# Patient Record
Sex: Male | Born: 1985 | Race: Black or African American | Hispanic: No | Marital: Single | Smoking: Never smoker
Health system: Southern US, Community
[De-identification: ages and names within clinical notes are randomized; demographics above are authoritative.]

## PROBLEM LIST (undated history)

## (undated) HISTORY — PX: TONSILLECTOMY: SUR1361

---
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
## 2012-02-22 ENCOUNTER — Ambulatory Visit (INDEPENDENT_AMBULATORY_CARE_PROVIDER_SITE_OTHER): Payer: BC Managed Care – PPO | Admitting: Family Medicine

## 2012-02-22 ENCOUNTER — Encounter: Payer: Self-pay | Admitting: Family Medicine

## 2012-02-22 VITALS — BP 118/72 | HR 62 | Temp 98.4°F | Ht 73.0 in | Wt 208.6 lb

## 2012-02-22 DIAGNOSIS — Z0000 Encounter for general adult medical examination without abnormal findings: Secondary | ICD-10-CM

## 2012-02-22 LAB — CBC WITH DIFFERENTIAL/PLATELET
Basophils Relative: 0.8 % (ref 0.0–3.0)
Eosinophils Relative: 1.5 % (ref 0.0–5.0)
HCT: 41.3 % (ref 39.0–52.0)
MCV: 95.2 fl (ref 78.0–100.0)
Monocytes Relative: 10.1 % (ref 3.0–12.0)
Neutrophils Relative %: 35.8 % — ABNORMAL LOW (ref 43.0–77.0)
Platelets: 234 10*3/uL (ref 150.0–400.0)
RBC: 4.33 Mil/uL (ref 4.22–5.81)
WBC: 4.1 10*3/uL — ABNORMAL LOW (ref 4.5–10.5)

## 2012-02-22 LAB — LIPID PANEL
HDL: 36 mg/dL — ABNORMAL LOW (ref >39.00)
Total CHOL/HDL Ratio: 5

## 2012-02-22 LAB — BASIC METABOLIC PANEL
BUN: 12 mg/dL (ref 6–23)
CO2: 27 mEq/L (ref 19–32)
Glucose, Bld: 97 mg/dL (ref 70–99)
Potassium: 4.1 mEq/L (ref 3.5–5.1)
Sodium: 138 mEq/L (ref 135–145)

## 2012-02-22 LAB — HEPATIC FUNCTION PANEL
ALT: 21 U/L (ref 0–53)
AST: 29 U/L (ref 0–37)
Albumin: 4.5 g/dL (ref 3.5–5.2)
Alkaline Phosphatase: 62 U/L (ref 39–117)
Total Protein: 7.4 g/dL (ref 6.0–8.3)

## 2012-02-22 LAB — TSH: TSH: 1.06 u[IU]/mL (ref 0.35–5.50)

## 2012-02-22 NOTE — Patient Instructions (Addendum)
Follow up in 1 year or as needed We'll notify you of your lab results Keep up the good work- you look great! Call with any questions or concerns Welcome!  We're glad to have you!!

## 2012-02-22 NOTE — Assessment & Plan Note (Signed)
New.  Pt's PE WNL.  Refusing flu shot.  Check labs.  Anticipatory guidance provided.

## 2012-02-22 NOTE — Progress Notes (Signed)
  Subjective:    Patient ID: Jackson Cox, male    DOB: December 01, 1985, 26 y.o.   MRN: [id number]  HPI New to establish.  No previous MD.  No concerns today.   Review of Systems Patient reports no vision/hearing changes, anorexia, fever ,adenopathy, persistant/recurrent hoarseness, swallowing issues, chest pain, palpitations, edema, persistant/recurrent cough, hemoptysis, dyspnea (rest,exertional, paroxysmal nocturnal), gastrointestinal  bleeding (melena, rectal bleeding), abdominal pain, excessive heart burn, GU symptoms (dysuria, hematuria, voiding/incontinence issues) syncope, focal weakness, memory loss, numbness & tingling, skin/hair/nail changes, depression, anxiety, abnormal bruising/bleeding, musculoskeletal symptoms/signs.     Objective:   Physical Exam BP 118/72  Pulse 62  Temp 98.4 F (36.9 C) (Oral)  Ht 6\' 1"  (1.854 m)  Wt 208 lb 9.6 oz (94.62 kg)  BMI 27.52 kg/m2  SpO2 99%  General Appearance:    Alert, cooperative, no distress, appears stated age  Head:    Normocephalic, without obvious abnormality, atraumatic  Eyes:    PERRL, conjunctiva/corneas clear, EOM's intact, fundi    benign, both eyes       Ears:    Normal TM's and external ear canals, both ears  Nose:   Nares normal, septum midline, mucosa normal, no drainage   or sinus tenderness  Throat:   Lips, mucosa, and tongue normal; teeth and gums normal  Neck:   Supple, symmetrical, trachea midline, no adenopathy;       thyroid:  No enlargement/tenderness/nodules; no carotid   bruit or JVD  Back:     Symmetric, no curvature, ROM normal, no CVA tenderness  Lungs:     Clear to auscultation bilaterally, respirations unlabored  Chest wall:    No tenderness or deformity  Heart:    Regular rate and rhythm, S1 and S2 normal, no murmur, rub   or gallop  Abdomen:     Soft, non-tender, bowel sounds active all four quadrants,    no masses, no organomegaly  Genitalia:    Normal male without lesion, discharge or tenderness    Rectal:      Extremities:   Extremities normal, atraumatic, no cyanosis or edema  Pulses:   2+ and symmetric all extremities  Skin:   Skin color, texture, turgor normal, no rashes or lesions  Lymph nodes:   Cervical, supraclavicular, and axillary nodes normal  Neurologic:   CNII-XII intact. Normal strength, sensation and reflexes      throughout          Assessment & Plan:

## 2012-04-01 ENCOUNTER — Encounter: Payer: Self-pay | Admitting: Internal Medicine

## 2012-04-01 ENCOUNTER — Ambulatory Visit (INDEPENDENT_AMBULATORY_CARE_PROVIDER_SITE_OTHER): Payer: BC Managed Care – PPO | Admitting: Internal Medicine

## 2012-04-01 VITALS — BP 130/80 | HR 77 | Wt 205.0 lb

## 2012-04-01 DIAGNOSIS — G8929 Other chronic pain: Secondary | ICD-10-CM

## 2012-04-01 DIAGNOSIS — M545 Low back pain: Secondary | ICD-10-CM

## 2012-04-01 DIAGNOSIS — R52 Pain, unspecified: Secondary | ICD-10-CM

## 2012-04-01 MED ORDER — TRAMADOL HCL 50 MG PO TABS: 50.0000 mg | ORAL_TABLET | Freq: Four times a day (QID) | ORAL | Status: DC | PRN

## 2012-04-01 MED ORDER — CYCLOBENZAPRINE HCL 5 MG PO TABS: ORAL_TABLET | ORAL | Status: DC

## 2012-04-01 NOTE — Progress Notes (Signed)
  Subjective:    Patient ID: Jackson Cox, male    DOB: 1985/10/15, 26 y.o.   MRN: [id number]  HPI  Symptoms began 03/31/12 after awakening; it was aggravated when he twisted his torso getting into his car. The pain is sharp with movement; but is present as a dull discomfort even sitting. Has only been partially responsive to ice, heat, and wearing a brace.  It is localized to the mid lower back w/o radiation.  This actually represents a recurrence of a chronic issue. He's had similar symptoms in the past which he relates to an arena football injury to the lower back.    Review of Systems   Fecal/urinary incontinence: no Numbness/Weakness: no  Night pain: only with movement  h/o cancer/immunosuppression:no PMH of  chronic steroid use: no       Objective:   Physical Exam  He is extremely fit and well muscled; he is obviously uncomfortable but in no acute distress  Gait is slow but otherwise normal. Tiptoe and heel walking is completed without difficulty  There is subtle asymmetry of the posterior para-spinous thoracic muscles; right larger than left. This is accentuated when he sits. Also while sitting there is asymmetry of the lumbar paraspinous muscles with the left greater than right.  He is able to lie flat and sit up without help. Straight leg raising is normal or negative.  Deep tendon reflexes, tone, and strength are otherwise normal.        Assessment & Plan:   #1 acute low back syndrome; clinically occult scoliosis is suggested.  Plan: See orders and recommendations

## 2012-04-01 NOTE — Patient Instructions (Addendum)
The best exercises for the low back include freestyle swimming, stretch aerobics, and yoga. 

## 2013-04-28 ENCOUNTER — Ambulatory Visit (INDEPENDENT_AMBULATORY_CARE_PROVIDER_SITE_OTHER): Payer: BC Managed Care – PPO | Admitting: Internal Medicine

## 2013-04-28 VITALS — BP 120/78 | HR 64 | Temp 98.7°F | Resp 16 | Ht 72.5 in | Wt 208.2 lb

## 2013-04-28 DIAGNOSIS — H5711 Ocular pain, right eye: Secondary | ICD-10-CM

## 2013-04-28 DIAGNOSIS — S058X2A Other injuries of left eye and orbit, initial encounter: Secondary | ICD-10-CM

## 2013-04-28 DIAGNOSIS — H5710 Ocular pain, unspecified eye: Secondary | ICD-10-CM

## 2013-04-28 DIAGNOSIS — S0590XA Unspecified injury of unspecified eye and orbit, initial encounter: Secondary | ICD-10-CM

## 2013-04-28 MED ORDER — OFLOXACIN 0.3 % OP SOLN: 1.0000 [drp] | OPHTHALMIC | Status: DC

## 2013-04-28 NOTE — Progress Notes (Signed)
  Subjective:    Patient ID: Jackson Cox, male    DOB: 03/05/1986, 27 y.o.   MRN: [id number]  HPI Jackson Cox is a 27 years old patient who present to the office complaining about having a foreign body  in his R Eye since yesterday, after  he took his shower. This morning he woke up with the same  discomfort in the eye and feel like he has some hair in the eye He used a chemical on his facial skin and may have got some in right eye. No vision loss, no discharge, minimal erythema. No contact lens use.    Review of Systems     Objective:   Physical Exam  Constitutional: He is oriented to person, place, and time. He appears well-developed and well-nourished.  HENT:  Head: Normocephalic.  Right Ear: External ear normal.  Left Ear: External ear normal.  Nose: Nose normal.  Mouth/Throat: Oropharynx is clear and moist.  Eyes: EOM and lids are normal. Pupils are equal, round, and reactive to light. Lids are everted and swept, no foreign bodies found. Right eye exhibits no chemosis, no discharge, no exudate and no hordeolum. No foreign body present in the right eye. Left eye exhibits no chemosis, no discharge, no exudate and no hordeolum. No foreign body present in the left eye. Right conjunctiva has no hemorrhage. Left conjunctiva is not injected. Left conjunctiva has no hemorrhage.  Slit lamp exam:      The right eye shows no corneal abrasion, no corneal flare, no corneal ulcer, no foreign body and no fluorescein uptake.    Neck: Normal range of motion.  Pulmonary/Chest: Effort normal.  Neurological: He is alert and oriented to person, place, and time. No cranial nerve deficit. He exhibits normal muscle tone. Coordination normal.  Skin: No rash noted.  Psychiatric: He has a normal mood and affect.          Assessment & Plan:  Ocuflox/Warm compresses

## 2013-04-28 NOTE — Patient Instructions (Signed)
Chemical Conjunctivitis  Chemical conjunctivitis is an irritation of the underside of the eyelid and the white part of the eye. Conjunctivitis can be caused by infection, allergy or chemical irritation. In your case it has been caused by a chemical irritation of the eye. Symptoms almost always include: tearing, light sensitivity, gritty feeling (sensation) in the eyes, swelling of your eyelids, and often severe pain. In spite of the severe pain, this irritation will run its course and will improve within 24 hours.   HOME CARE INSTRUCTIONS   · To ease discomfort apply a cool, clean wash cloth to your eye for 10 to 20 minutes, 3 to 4 times per day.  · Do not rub your eyes.  · Gently wipe away any discharge from the eyes with moistened tissues.  · Wash your hands often with soap and use paper towels to dry.  · Sunglasses may be helpful if light bothers your eyes.  · Do not use eye make-up.  · Do not use contact lenses until the irritation is gone.  · Do not operate machinery or drive if your vision is blurred.  · Take medications as directed by your caregiver. Artificial tears may ease discomfort.  · Avoid the chemical or surroundings which caused the problem. Always use eye protection as necessary.  SEEK MEDICAL CARE IF:   · The eye is still pink (inflamed) 3 days after beginning treatment.  · Pain in the eye increases.  · You have discharge coming from either eye.  · Your eyelids are stuck together in the morning.  · You have an increased sensitivity to light.  · An oral temperature above 102° F (38.9° C) develops.  · You develop facial pain.  · You have any problems that may be related to the medicine you are taking.  SEEK IMMEDIATE MEDICAL CARE IF:   · Your vision is getting worse.  · You develop severe eye pain.  MAKE SURE YOU:   · Understand these instructions.  · Will watch your condition.  · Will get help right away if you are not doing well or get worse.  Document Released: 03/07/2005 Document Revised:  08/20/2011 Document Reviewed: 01/14/2008  [employer]® Patient Information ©2014 [employer].

## 2014-07-29 ENCOUNTER — Encounter: Payer: Self-pay | Admitting: Family Medicine

## 2014-07-29 ENCOUNTER — Ambulatory Visit (INDEPENDENT_AMBULATORY_CARE_PROVIDER_SITE_OTHER): Payer: BLUE CROSS/BLUE SHIELD | Admitting: Family Medicine

## 2014-07-29 VITALS — BP 122/64 | HR 79 | Temp 97.9°F | Resp 16 | Wt 207.8 lb

## 2014-07-29 DIAGNOSIS — J309 Allergic rhinitis, unspecified: Secondary | ICD-10-CM | POA: Insufficient documentation

## 2014-07-29 DIAGNOSIS — M79641 Pain in right hand: Secondary | ICD-10-CM

## 2014-07-29 DIAGNOSIS — J3089 Other allergic rhinitis: Secondary | ICD-10-CM

## 2014-07-29 DIAGNOSIS — M79642 Pain in left hand: Secondary | ICD-10-CM

## 2014-07-29 MED ORDER — FLUTICASONE PROPIONATE 50 MCG/ACT NA SUSP: 2.0000 | Freq: Every day | NASAL | Status: DC

## 2014-07-29 MED ORDER — MELOXICAM 15 MG PO TABS: 15.0000 mg | ORAL_TABLET | Freq: Every day | ORAL | Status: DC

## 2014-07-29 NOTE — Patient Instructions (Signed)
Schedule your complete physical in 6 months Continue the Zyrtec daily Add Flonase- 2 sprays each nostril daily Drink plenty of fluids Start the Mobic- once daily w/ food- for 7-10 days and then as needed for pain Add tylenol as needed but no additional ibuprofen, aleve, motrin, etc If no improvement in the hand pain in the next 2 weeks, call me! Ozzie HoyleGALIND WASHINGTON- get your butt to the gym and work out w/ your son, who is a Systems analystpersonal trainer!!!  Doctor's orders! Call with any questions or concerns Hang in there!!!

## 2014-07-29 NOTE — Assessment & Plan Note (Signed)
New.  Suspect this is due to pt recently starting to play guitar and muscle strain/fatigue.  No joint tenderness that would be indicative of RA.  Start scheduled NSAIDs.  Reviewed supportive care and red flags that should prompt return.  Pt expressed understanding and is in agreement w/ plan.

## 2014-07-29 NOTE — Assessment & Plan Note (Signed)
New.  Continue Zyrtec.  Add nasal steroid.  Mucinex DM for relief.  Reviewed supportive care and red flags that should prompt return.  Pt expressed understanding and is in agreement w/ plan.

## 2014-07-29 NOTE — Progress Notes (Signed)
Pre visit review using our clinic review tool, if applicable. No additional management support is needed unless otherwise documented below in the visit note. 

## 2014-07-29 NOTE — Progress Notes (Signed)
   Subjective:    Patient ID: Jackson Cox, male    DOB: Dec 15, 1985, 29 y.o.   MRN: [id number]  HPI URI- sxs started as allergies.  No response to Zyrtec.  Took OTC cold and sinus meds w/o relief.  Developed swelling and tenderness on L side of neck.  No facial pain/pressure.  No sore throat since Day 1.  No fevers.  No ear pain/pressure.  + nasal congestion.  + PND.  + cough- intermittently productive.  + sick contacts.  Bilateral hand pain- sxs started 3 weeks ago.  + stiffness/soreness.  Pain both in AM and throughout the day.  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  No pain in joints- either PIP or MCP   Review of Systems For ROS see HPI     Objective:   Physical Exam  Constitutional: He appears well-developed and well-nourished. No distress.  HENT:  Head: Normocephalic and atraumatic.  No TTP over sinuses + turbinate edema + PND TMs normal bilaterally  Eyes: Conjunctivae and EOM are normal. Pupils are equal, round, and reactive to light.  Neck: Normal range of motion. Neck supple.  Cardiovascular: Normal rate, regular rhythm and normal heart sounds.   Pulmonary/Chest: Effort normal and breath sounds normal. No respiratory distress. He has no wheezes.  Musculoskeletal: He exhibits tenderness (TTP over proximal phlanges w/o swelling.  no TTP over PIP and MCP joints). He exhibits no edema.  Lymphadenopathy:    He has no cervical adenopathy.  Skin: Skin is warm and dry.  Vitals reviewed.         Assessment & Plan:

## 2014-08-02 ENCOUNTER — Encounter: Payer: Self-pay | Admitting: Family Medicine

## 2014-09-19 ENCOUNTER — Ambulatory Visit (INDEPENDENT_AMBULATORY_CARE_PROVIDER_SITE_OTHER): Payer: BLUE CROSS/BLUE SHIELD | Admitting: Family Medicine

## 2014-09-19 VITALS — BP 124/62 | HR 62 | Temp 97.8°F | Resp 20 | Ht 72.0 in | Wt 209.1 lb

## 2014-09-19 DIAGNOSIS — S39012A Strain of muscle, fascia and tendon of lower back, initial encounter: Secondary | ICD-10-CM

## 2014-09-19 DIAGNOSIS — M62830 Muscle spasm of back: Secondary | ICD-10-CM | POA: Diagnosis not present

## 2014-09-19 MED ORDER — CYCLOBENZAPRINE HCL 5 MG PO TABS: ORAL_TABLET | ORAL | Status: DC

## 2014-09-19 NOTE — Patient Instructions (Signed)
You likely have a sprained ligament or strained muscle in the low back, which can lead to some muscle spasm as well. Try the mobic each morning (do not combine with other over the counter pain relievers), flexeril at night if needed.  Heat or ice to area as needed and the other treatments and exercises in the back care manual as tolerated.   Return to the clinic or go to the nearest emergency room if any of your symptoms worsen or new symptoms occur.  If back pain not improving in next 10 days, or these flares persist - return to discuss further evaluation and imaging.

## 2014-09-19 NOTE — Progress Notes (Addendum)
Subjective:  This chart was scribed for Jackson Staggers, MD by [facility], medical scribe at [facility].The patient was seen in exam room 12 and the patient's care was started at 10:33 AM.   Patient ID: Jackson Cox, male    DOB: 10/24/1985, 29 y.o.   MRN: [id number] Chief Complaint  Patient presents with  . Back Pain    lower back spasms.   reinjured his back yesterday   HPI HPI Comments: KASE SHUGHART is a 29 y.o. male who presents to [facility] complaining of recurrent non radiating lower back pain, acute onset one day ago, while playing football. He turned wrong and "tweaked" his back. He had a football injury about 7-8 years ago he took a helmet to the lower back. He is a Systems analyst at [employer]. He has taken flexeril in the past which has provided relief. He denies urine, bowel incontinence, numbness in his groin, numbness or weakness in his lower extremities.  Patient Active Problem List   Diagnosis Date Noted  . Allergic rhinitis 07/29/2014  . Bilateral hand pain 07/29/2014   History reviewed. No pertinent past medical history. Past Surgical History  Procedure Laterality Date  . Tonsillectomy     No Known Allergies Prior to Admission medications   Medication Sig Start Date End Date Taking? Authorizing Provider  cetirizine (ZYRTEC) 10 MG tablet Take 10 mg by mouth daily.   Yes Historical Provider, MD  fluticasone (FLONASE) 50 MCG/ACT nasal spray Place 2 sprays into both nostrils daily. 07/29/14  Yes Sheliah Hatch, MD  meloxicam (MOBIC) 15 MG tablet Take 1 tablet (15 mg total) by mouth daily. 07/29/14  Yes Sheliah Hatch, MD  Multiple Vitamins-Minerals (MULTIVITAMIN ADULT PO) Take 1 tablet by mouth daily.   Yes Historical Provider, MD  Omega-3 Fatty Acids (FISH OIL) 1200 MG CAPS Take 1 capsule by mouth daily.   Yes Historical Provider, MD   History   Social History  . Marital Status: Single   Spouse Name: N/A  . Number of Children: N/A  . Years of Education: N/A   Occupational History  . Not on file.   Social History Main Topics  . Smoking status: Never Smoker   . Smokeless tobacco: Not on file  . Alcohol Use: 0.0 oz/week    0 Standard drinks or equivalent per week     Comment: occasionally  . Drug Use: No  . Sexual Activity: Not on file   Other Topics Concern  . Not on file   Social History Narrative   Review of Systems  Musculoskeletal: Positive for back pain.  Neurological: Negative for weakness and numbness.      Objective:  BP 124/62 mmHg  Pulse 62  Temp(Src) 97.8 F (36.6 C) (Oral)  Resp 20  Ht 6' (1.829 m)  Wt 209 lb 2 oz (94.858 kg)  BMI 28.36 kg/m2  SpO2 100% Physical Exam  Constitutional: He is oriented to person, place, and time. He appears well-developed and well-nourished. No distress.  HENT:  Head: Normocephalic and atraumatic.  Eyes: Pupils are equal, round, and reactive to light.  Neck: Normal range of motion.  Cardiovascular: Normal rate and regular rhythm.   Pulmonary/Chest: Effort normal. No respiratory distress.  Musculoskeletal: Normal range of motion.  Tenderness and spasms over the lower lumbar spine approximately L4. Sciatic notch and SI joint are non tender. Minimial forward flexion. Decreased forward flexion but equal ROM. Able to  heel and toe walk without diffculty. Negative straight leg raise bilaterally.  Neurological: He is alert and oriented to person, place, and time.  Reflex Scores:      Patellar reflexes are 1+ on the right side and 1+ on the left side.      Achilles reflexes are 1+ on the right side and 1+ on the left side. Skin: Skin is warm and dry.  Psychiatric: He has a normal mood and affect. His behavior is normal.  Nursing note and vitals reviewed.     Assessment & Plan:   Jackson HuaBrandon D Hindle is a 29 y.o. male Low back strain, initial encounter - Plan: cyclobenzaprine (FLEXERIL) 5 MG tablet  Muscle  spasm of back - Plan: cyclobenzaprine (FLEXERIL) 5 MG tablet  Flare of previous back pain, similar area and sx's and overall reassuring exam.   -trial of flexeril, mobic for now (SED), back care manual - has at home - for info and HEP,  rtc in next 10 days for imaging if not improving, sooner if worse. Consider MRI if pain is recurrent. Understanding expressed. Rtc/er precautions.   Meds ordered this encounter  Medications  . cyclobenzaprine (FLEXERIL) 5 MG tablet    Sig: 1-2 tabs by mouth up to every 8 hours as needed. Start with one pill by mouth each bedtime as needed due to sedation    Dispense:  20 tablet    Refill:  0   Patient Instructions  You likely have a sprained ligament or strained muscle in the low back, which can lead to some muscle spasm as well. Try the mobic each morning (do not combine with other over the counter pain relievers), flexeril at night if needed.  Heat or ice to area as needed and the other treatments and exercises in the back care manual as tolerated.   Return to the clinic or go to the nearest emergency room if any of your symptoms worsen or new symptoms occur.  If back pain not improving in next 10 days, or these flares persist - return to discuss further evaluation and imaging.    I personally performed the services described in this documentation, which was scribed in my presence. The recorded information has been reviewed and considered, and addended by me as needed.

## 2014-11-18 ENCOUNTER — Ambulatory Visit (INDEPENDENT_AMBULATORY_CARE_PROVIDER_SITE_OTHER): Payer: BLUE CROSS/BLUE SHIELD | Admitting: Family Medicine

## 2014-11-18 ENCOUNTER — Encounter: Payer: Self-pay | Admitting: Family Medicine

## 2014-11-18 VITALS — BP 118/80 | HR 68 | Temp 98.0°F | Resp 16 | Wt 205.4 lb

## 2014-11-18 DIAGNOSIS — M25531 Pain in right wrist: Secondary | ICD-10-CM | POA: Insufficient documentation

## 2014-11-18 MED ORDER — FLUTICASONE PROPIONATE 50 MCG/ACT NA SUSP: 2.0000 | Freq: Every day | NASAL | Status: AC

## 2014-11-18 NOTE — Progress Notes (Signed)
Pre visit review using our clinic review tool, if applicable. No additional management support is needed unless otherwise documented below in the visit note. 

## 2014-11-18 NOTE — Progress Notes (Signed)
   Subjective:    Patient ID: Jackson Cox, male    DOB: January 10, 1986, 29 y.o.   MRN: [id number]  HPI Wrist pain- R wrist pain, fell 2 months ago and wrist collapsed in forced flexion.  No medical attention at time of fall.  Pt reports pain has gotten 'a lot better'.  Pain w/ extremes of flexion and extension as well as weight bearing in extension.  Also pain w/ side to side motion.  No swelling.   Review of Systems For ROS see HPI     Objective:   Physical Exam  Constitutional: He is oriented to person, place, and time. He appears well-developed and well-nourished.  Cardiovascular: Intact distal pulses.   Musculoskeletal: He exhibits tenderness. He exhibits no edema.  No TTP over R snuff box.  + TTP over radial head.  Pain w/ load bearing in extension, lateral deviation of wrist.  Neurological: He is alert and oriented to person, place, and time.  Skin: Skin is warm and dry. No erythema.  Vitals reviewed.         Assessment & Plan:

## 2014-11-18 NOTE — Assessment & Plan Note (Signed)
New.  Given duration of pain and limited movement as well as career as Systems analyst, pt needs to see hand specialist.  Will hold on xrays b/c hand specialist will likely want their own films.  Start Mobic.  Ice.  Reviewed supportive care and red flags that should prompt return.  Pt expressed understanding and is in agreement w/ plan.

## 2014-11-18 NOTE — Patient Instructions (Signed)
Follow up as needed We'll call you with your hand referral Mobic for pain and inflammation ICE for pain and swelling Call with any questions or concerns Hang in there!!

## 2014-12-09 ENCOUNTER — Encounter: Payer: Self-pay | Admitting: Family Medicine

## 2015-01-26 ENCOUNTER — Encounter: Payer: Self-pay | Admitting: Behavioral Health

## 2015-01-26 ENCOUNTER — Telehealth: Payer: Self-pay | Admitting: Behavioral Health

## 2015-01-26 NOTE — Telephone Encounter (Signed)
Pre-Visit Call completed with patient and chart updated.   Pre-Visit Info documented in Specialty Comments under [employer].    

## 2015-01-27 ENCOUNTER — Ambulatory Visit (INDEPENDENT_AMBULATORY_CARE_PROVIDER_SITE_OTHER): Payer: BLUE CROSS/BLUE SHIELD | Admitting: Family Medicine

## 2015-01-27 ENCOUNTER — Encounter: Payer: Self-pay | Admitting: Family Medicine

## 2015-01-27 VITALS — BP 106/80 | HR 64 | Temp 98.0°F | Resp 16 | Ht 72.0 in | Wt 207.0 lb

## 2015-01-27 DIAGNOSIS — Z23 Encounter for immunization: Secondary | ICD-10-CM

## 2015-01-27 DIAGNOSIS — Z0000 Encounter for general adult medical examination without abnormal findings: Secondary | ICD-10-CM | POA: Diagnosis not present

## 2015-01-27 NOTE — Assessment & Plan Note (Signed)
Pt's PE WNL.  Tdap updated today.  Check labs.  Anticipatory guidance provided.  

## 2015-01-27 NOTE — Progress Notes (Signed)
   Subjective:    Patient ID: Jackson Cox, male    DOB: 22-Apr-1986, 29 y.o.   MRN: [id number]  HPI CPE- no concerns today.   Review of Systems Patient reports no vision/hearing changes, anorexia, fever ,adenopathy, persistant/recurrent hoarseness, swallowing issues, chest pain, palpitations, edema, persistant/recurrent cough, hemoptysis, dyspnea (rest,exertional, paroxysmal nocturnal), gastrointestinal  bleeding (melena, rectal bleeding), abdominal pain, excessive heart burn, GU symptoms (dysuria, hematuria, voiding/incontinence issues) syncope, focal weakness, memory loss, numbness & tingling, skin/hair/nail changes, depression, anxiety, abnormal bruising/bleeding, musculoskeletal symptoms/signs.     Objective:   Physical Exam General Appearance:    Alert, cooperative, no distress, appears stated age  Head:    Normocephalic, without obvious abnormality, atraumatic  Eyes:    PERRL, conjunctiva/corneas clear, EOM's intact, fundi    benign, both eyes       Ears:    Normal TM's and external ear canals, both ears  Nose:   Nares normal, septum midline, mucosa normal, no drainage   or sinus tenderness  Throat:   Lips, mucosa, and tongue normal; teeth and gums normal  Neck:   Supple, symmetrical, trachea midline, no adenopathy;       thyroid:  No enlargement/tenderness/nodules  Back:     Symmetric, no curvature, ROM normal, no CVA tenderness  Lungs:     Clear to auscultation bilaterally, respirations unlabored  Chest wall:    No tenderness or deformity  Heart:    Regular rate and rhythm, S1 and S2 normal, no murmur, rub   or gallop  Abdomen:     Soft, non-tender, bowel sounds active all four quadrants,    no masses, no organomegaly  Genitalia:    Normal male without lesion, masses,discharge or tenderness  Rectal:    Deferred due to young age  Extremities:   Extremities normal, atraumatic, no cyanosis or edema  Pulses:   2+ and symmetric all extremities  Skin:   Skin color, texture,  turgor normal, no rashes or lesions  Lymph nodes:   Cervical, supraclavicular, and axillary nodes normal  Neurologic:   CNII-XII intact. Normal strength, sensation and reflexes      throughout          Assessment & Plan:

## 2015-01-27 NOTE — Patient Instructions (Signed)
Follow up in 1 year or as needed We'll notify you of your lab results and make any changes if needed Keep up the good work!  You look great! Call with any questions or concerns ENJOY THE BIG DAY!!!

## 2015-01-27 NOTE — Progress Notes (Signed)
Pre visit review using our clinic review tool, if applicable. No additional management support is needed unless otherwise documented below in the visit note. 

## 2015-01-28 LAB — LIPID PANEL
CHOLESTEROL: 157 mg/dL (ref 0–200)
HDL: 37.1 mg/dL — ABNORMAL LOW (ref >39.00)
LDL CALC: 110 mg/dL — AB (ref 0–99)
NonHDL: 119.46
Total CHOL/HDL Ratio: 4
Triglycerides: 49 mg/dL (ref 0.0–149.0)
VLDL: 9.8 mg/dL (ref 0.0–40.0)

## 2015-01-28 LAB — BASIC METABOLIC PANEL
BUN: 11 mg/dL (ref 6–23)
CALCIUM: 9.5 mg/dL (ref 8.4–10.5)
CO2: 29 meq/L (ref 19–32)
CREATININE: 1.21 mg/dL (ref 0.40–1.50)
Chloride: 103 mEq/L (ref 96–112)
GFR: 90.32 mL/min (ref >60.00)
GLUCOSE: 86 mg/dL (ref 70–99)
Potassium: 3.7 mEq/L (ref 3.5–5.1)
SODIUM: 140 meq/L (ref 135–145)

## 2015-01-28 LAB — CBC WITH DIFFERENTIAL/PLATELET
BASOS ABS: 0 10*3/uL (ref 0.0–0.1)
Basophils Relative: 0.5 % (ref 0.0–3.0)
EOS ABS: 0 10*3/uL (ref 0.0–0.7)
Eosinophils Relative: 0.6 % (ref 0.0–5.0)
HEMATOCRIT: 40.5 % (ref 39.0–52.0)
HEMOGLOBIN: 13.7 g/dL (ref 13.0–17.0)
LYMPHS PCT: 39.1 % (ref 12.0–46.0)
Lymphs Abs: 2.2 10*3/uL (ref 0.7–4.0)
MCHC: 33.8 g/dL (ref 30.0–36.0)
MCV: 93.2 fl (ref 78.0–100.0)
MONOS PCT: 5.8 % (ref 3.0–12.0)
Monocytes Absolute: 0.3 10*3/uL (ref 0.1–1.0)
Neutro Abs: 3.1 10*3/uL (ref 1.4–7.7)
Neutrophils Relative %: 54 % (ref 43.0–77.0)
PLATELETS: 236 10*3/uL (ref 150.0–400.0)
RBC: 4.34 Mil/uL (ref 4.22–5.81)
RDW: 12.7 % (ref 11.5–15.5)
WBC: 5.7 10*3/uL (ref 4.0–10.5)

## 2015-01-28 LAB — HEPATIC FUNCTION PANEL
ALBUMIN: 4.6 g/dL (ref 3.5–5.2)
ALT: 15 U/L (ref 0–53)
AST: 23 U/L (ref 0–37)
Alkaline Phosphatase: 69 U/L (ref 39–117)
Bilirubin, Direct: 0.2 mg/dL (ref 0.0–0.3)
TOTAL PROTEIN: 7.3 g/dL (ref 6.0–8.3)
Total Bilirubin: 0.8 mg/dL (ref 0.2–1.2)

## 2015-01-28 LAB — TSH: TSH: 0.65 u[IU]/mL (ref 0.35–4.50)

## 2015-08-09 ENCOUNTER — Telehealth: Payer: Self-pay | Admitting: Family Medicine

## 2015-08-09 NOTE — Telephone Encounter (Signed)
LM for pt to call and schedule flu shot or update records. °

## 2017-10-29 ENCOUNTER — Encounter: Payer: Self-pay | Admitting: General Practice

## 2018-09-20 ENCOUNTER — Emergency Department (HOSPITAL_COMMUNITY): Payer: No Typology Code available for payment source

## 2018-09-20 ENCOUNTER — Emergency Department (HOSPITAL_COMMUNITY): Payer: No Typology Code available for payment source | Admitting: Certified Registered Nurse Anesthetist

## 2018-09-20 ENCOUNTER — Emergency Department (HOSPITAL_COMMUNITY)
Admission: EM | Admit: 2018-09-20 | Discharge: 2018-09-20 | Disposition: A | Discharge status: Home / Self Care | Payer: No Typology Code available for payment source | Attending: Emergency Medicine | Admitting: Emergency Medicine

## 2018-09-20 ENCOUNTER — Encounter (HOSPITAL_COMMUNITY): Payer: Self-pay | Admitting: Emergency Medicine

## 2018-09-20 ENCOUNTER — Other Ambulatory Visit: Payer: Self-pay

## 2018-09-20 ENCOUNTER — Encounter (HOSPITAL_COMMUNITY)
Admission: EM | Disposition: A | Discharge status: Home / Self Care | Payer: Self-pay | Source: Home / Self Care | Attending: Emergency Medicine

## 2018-09-20 DIAGNOSIS — S61212A Laceration without foreign body of right middle finger without damage to nail, initial encounter: Secondary | ICD-10-CM | POA: Insufficient documentation

## 2018-09-20 DIAGNOSIS — W312XXA Contact with powered woodworking and forming machines, initial encounter: Secondary | ICD-10-CM | POA: Diagnosis not present

## 2018-09-20 DIAGNOSIS — Z79899 Other long term (current) drug therapy: Secondary | ICD-10-CM | POA: Insufficient documentation

## 2018-09-20 DIAGNOSIS — S61312A Laceration without foreign body of right middle finger with damage to nail, initial encounter: Secondary | ICD-10-CM | POA: Diagnosis present

## 2018-09-20 HISTORY — PX: AMPUTATION: SHX166

## 2018-09-20 LAB — CBC
HCT: 44.3 % (ref 39.0–52.0)
Hemoglobin: 14.4 g/dL (ref 13.0–17.0)
MCH: 30.9 pg (ref 26.0–34.0)
MCHC: 32.5 g/dL (ref 30.0–36.0)
MCV: 95.1 fL (ref 80.0–100.0)
Platelets: 255 10*3/uL (ref 150–400)
RBC: 4.66 MIL/uL (ref 4.22–5.81)
RDW: 11.9 % (ref 11.5–15.5)
WBC: 7.7 10*3/uL (ref 4.0–10.5)
nRBC: 0 % (ref 0.0–0.2)

## 2018-09-20 LAB — BASIC METABOLIC PANEL
Anion gap: 10 (ref 5–15)
BUN: 17 mg/dL (ref 6–20)
CO2: 27 mmol/L (ref 22–32)
Calcium: 9.3 mg/dL (ref 8.9–10.3)
Chloride: 101 mmol/L (ref 98–111)
Creatinine, Ser: 1.41 mg/dL — ABNORMAL HIGH (ref 0.61–1.24)
GFR calc Af Amer: >60 mL/min (ref >60)
GFR calc non Af Amer: >60 mL/min (ref >60)
Glucose, Bld: 110 mg/dL — ABNORMAL HIGH (ref 70–99)
Potassium: 4 mmol/L (ref 3.5–5.1)
Sodium: 138 mmol/L (ref 135–145)

## 2018-09-20 SURGERY — AMPUTATION DIGIT
Anesthesia: General | Site: Finger | Laterality: Right

## 2018-09-20 MED ORDER — FENTANYL CITRATE (PF) 100 MCG/2ML IJ SOLN
INTRAMUSCULAR | Status: AC
  Filled 2018-09-20: qty 2

## 2018-09-20 MED ORDER — HYDROCODONE-ACETAMINOPHEN 5-325 MG PO TABS: 1.0000 | ORAL_TABLET | ORAL | 0 refills | Status: DC | PRN

## 2018-09-20 MED ORDER — KETOROLAC TROMETHAMINE 30 MG/ML IJ SOLN: 30.0000 mg | Freq: Once | INTRAMUSCULAR | Status: DC | PRN

## 2018-09-20 MED ORDER — BUPIVACAINE HCL (PF) 0.5 % IJ SOLN
INTRAMUSCULAR | Status: DC | PRN
  Administered 2018-09-20: 10 mL

## 2018-09-20 MED ORDER — MORPHINE SULFATE (PF) 4 MG/ML IV SOLN
4.0000 mg | Freq: Once | INTRAVENOUS | Status: AC
  Administered 2018-09-20: 4 mg via INTRAVENOUS
  Filled 2018-09-20: qty 1

## 2018-09-20 MED ORDER — TETANUS-DIPHTH-ACELL PERTUSSIS 5-2.5-18.5 LF-MCG/0.5 IM SUSP
0.5000 mL | Freq: Once | INTRAMUSCULAR | Status: AC
  Administered 2018-09-20: 0.5 mL via INTRAMUSCULAR
  Filled 2018-09-20: qty 0.5

## 2018-09-20 MED ORDER — MIDAZOLAM HCL 2 MG/2ML IJ SOLN
INTRAMUSCULAR | Status: AC
  Filled 2018-09-20: qty 2

## 2018-09-20 MED ORDER — CEPHALEXIN 250 MG PO CAPS: 250.0000 mg | ORAL_CAPSULE | Freq: Four times a day (QID) | ORAL | 0 refills | Status: AC

## 2018-09-20 MED ORDER — MIDAZOLAM HCL 5 MG/5ML IJ SOLN
INTRAMUSCULAR | Status: DC | PRN
  Administered 2018-09-20: 2 mg via INTRAVENOUS

## 2018-09-20 MED ORDER — LIDOCAINE 2% (20 MG/ML) 5 ML SYRINGE
INTRAMUSCULAR | Status: DC | PRN
  Administered 2018-09-20: 100 mg via INTRAVENOUS

## 2018-09-20 MED ORDER — EPHEDRINE 5 MG/ML INJ
INTRAVENOUS | Status: AC
  Filled 2018-09-20: qty 10

## 2018-09-20 MED ORDER — SUCCINYLCHOLINE CHLORIDE 20 MG/ML IJ SOLN
INTRAMUSCULAR | Status: DC | PRN
  Administered 2018-09-20: 140 mg via INTRAVENOUS

## 2018-09-20 MED ORDER — DEXMEDETOMIDINE HCL 200 MCG/2ML IV SOLN
INTRAVENOUS | Status: DC | PRN
  Administered 2018-09-20: 16 ug via INTRAVENOUS

## 2018-09-20 MED ORDER — PROPOFOL 10 MG/ML IV BOLUS
INTRAVENOUS | Status: DC | PRN
  Administered 2018-09-20: 200 mg via INTRAVENOUS

## 2018-09-20 MED ORDER — FENTANYL CITRATE (PF) 100 MCG/2ML IJ SOLN: 25.0000 ug | INTRAMUSCULAR | Status: DC | PRN

## 2018-09-20 MED ORDER — PROMETHAZINE HCL 25 MG/ML IJ SOLN: 6.2500 mg | INTRAMUSCULAR | Status: DC | PRN

## 2018-09-20 MED ORDER — ROCURONIUM BROMIDE 10 MG/ML (PF) SYRINGE
PREFILLED_SYRINGE | INTRAVENOUS | Status: DC | PRN
  Administered 2018-09-20: 5 mg via INTRAVENOUS

## 2018-09-20 MED ORDER — EPHEDRINE SULFATE-NACL 50-0.9 MG/10ML-% IV SOSY
PREFILLED_SYRINGE | INTRAVENOUS | Status: DC | PRN
  Administered 2018-09-20 (×4): 10 mg via INTRAVENOUS

## 2018-09-20 MED ORDER — DEXMEDETOMIDINE HCL IN NACL 200 MCG/50ML IV SOLN
INTRAVENOUS | Status: AC
  Filled 2018-09-20: qty 50

## 2018-09-20 MED ORDER — CEFAZOLIN SODIUM-DEXTROSE 2-4 GM/100ML-% IV SOLN
INTRAVENOUS | Status: AC
  Filled 2018-09-20: qty 100

## 2018-09-20 MED ORDER — CEFAZOLIN SODIUM-DEXTROSE 1-4 GM/50ML-% IV SOLN
INTRAVENOUS | Status: DC | PRN
  Administered 2018-09-20: 1 g via INTRAVENOUS

## 2018-09-20 MED ORDER — DEXAMETHASONE SODIUM PHOSPHATE 10 MG/ML IJ SOLN
INTRAMUSCULAR | Status: DC | PRN
  Administered 2018-09-20: 5 mg via INTRAVENOUS

## 2018-09-20 MED ORDER — CEFAZOLIN SODIUM-DEXTROSE 1-4 GM/50ML-% IV SOLN
1.0000 g | Freq: Once | INTRAVENOUS | Status: AC
  Administered 2018-09-20: 1 g via INTRAVENOUS
  Filled 2018-09-20: qty 50

## 2018-09-20 MED ORDER — PHENYLEPHRINE 40 MCG/ML (10ML) SYRINGE FOR IV PUSH (FOR BLOOD PRESSURE SUPPORT)
PREFILLED_SYRINGE | INTRAVENOUS | Status: DC | PRN
  Administered 2018-09-20 (×2): 80 ug via INTRAVENOUS

## 2018-09-20 MED ORDER — PROPOFOL 10 MG/ML IV BOLUS
INTRAVENOUS | Status: AC
  Filled 2018-09-20: qty 20

## 2018-09-20 MED ORDER — FENTANYL CITRATE (PF) 100 MCG/2ML IJ SOLN
INTRAMUSCULAR | Status: DC | PRN
  Administered 2018-09-20 (×2): 100 ug via INTRAVENOUS

## 2018-09-20 MED ORDER — 0.9 % SODIUM CHLORIDE (POUR BTL) OPTIME
TOPICAL | Status: DC | PRN
  Administered 2018-09-20: 1000 mL

## 2018-09-20 MED ORDER — PHENYLEPHRINE 40 MCG/ML (10ML) SYRINGE FOR IV PUSH (FOR BLOOD PRESSURE SUPPORT)
PREFILLED_SYRINGE | INTRAVENOUS | Status: AC
  Filled 2018-09-20: qty 10

## 2018-09-20 MED ORDER — LACTATED RINGERS IV SOLN
INTRAVENOUS | Status: DC | PRN
  Administered 2018-09-20: 21:00:00 via INTRAVENOUS

## 2018-09-20 MED ORDER — BUPIVACAINE HCL (PF) 0.5 % IJ SOLN
INTRAMUSCULAR | Status: AC
  Filled 2018-09-20: qty 30

## 2018-09-20 MED ORDER — ONDANSETRON HCL 4 MG/2ML IJ SOLN
INTRAMUSCULAR | Status: DC | PRN
  Administered 2018-09-20: 4 mg via INTRAVENOUS

## 2018-09-20 SURGICAL SUPPLY — 39 items
BAG SPEC THK2 15X12 ZIP CLS (MISCELLANEOUS) ×1
BAG ZIPLOCK 12X15 (MISCELLANEOUS) ×3 IMPLANT
BNDG COHESIVE 1X5 TAN STRL LF (GAUZE/BANDAGES/DRESSINGS) ×2 IMPLANT
BNDG GAUZE ELAST 4 BULKY (GAUZE/BANDAGES/DRESSINGS) ×1 IMPLANT
CORD BIPOLAR FORCEPS 12FT (ELECTRODE) ×3 IMPLANT
COVER SURGICAL LIGHT HANDLE (MISCELLANEOUS) ×3 IMPLANT
COVER WAND RF STERILE (DRAPES) IMPLANT
CUFF TOURN SGL QUICK 18X4 (TOURNIQUET CUFF) ×3 IMPLANT
CUFF TOURN SGL QUICK 24 (TOURNIQUET CUFF)
CUFF TRNQT CYL 24X4X16.5-23 (TOURNIQUET CUFF) IMPLANT
DRAIN PENROSE 18X1/2 LTX STRL (DRAIN) IMPLANT
DRAPE SURG 17X11 SM STRL (DRAPES) ×2 IMPLANT
ELECT REM PT RETURN 15FT ADLT (MISCELLANEOUS) ×3 IMPLANT
GAUZE SPONGE 4X4 12PLY STRL (GAUZE/BANDAGES/DRESSINGS) ×3 IMPLANT
GAUZE XEROFORM 1X8 LF (GAUZE/BANDAGES/DRESSINGS) ×2 IMPLANT
GLOVE BIOGEL M 8.0 STRL (GLOVE) ×6 IMPLANT
GLOVE BIOGEL PI IND STRL 6.5 (GLOVE) ×1 IMPLANT
GLOVE BIOGEL PI IND STRL 7.0 (GLOVE) ×1 IMPLANT
GLOVE BIOGEL PI IND STRL 7.5 (GLOVE) ×1 IMPLANT
GLOVE BIOGEL PI INDICATOR 6.5 (GLOVE) ×2
GLOVE BIOGEL PI INDICATOR 7.0 (GLOVE) ×2
GLOVE BIOGEL PI INDICATOR 7.5 (GLOVE) ×2
HANDPIECE INTERPULSE COAX TIP (DISPOSABLE)
KIT BASIN OR (CUSTOM PROCEDURE TRAY) ×3 IMPLANT
KIT TURNOVER KIT A (KITS) IMPLANT
NEEDLE HYPO 22GX1.5 SAFETY (NEEDLE) ×3 IMPLANT
NS IRRIG 1000ML POUR BTL (IV SOLUTION) ×3 IMPLANT
PACK ORTHO EXTREMITY (CUSTOM PROCEDURE TRAY) ×3 IMPLANT
PROTECTOR NERVE ULNAR (MISCELLANEOUS) ×3 IMPLANT
SET HNDPC FAN SPRY TIP SCT (DISPOSABLE) ×1 IMPLANT
STOCKINETTE 6  STRL (DRAPES)
STOCKINETTE 6 STRL (DRAPES) ×1 IMPLANT
SUT ETHILON 3 0 PS 1 (SUTURE) ×6 IMPLANT
SUT ETHILON 4 0 PS 2 18 (SUTURE) ×2 IMPLANT
SUT PLAIN 5 0 P 3 18 (SUTURE) ×2 IMPLANT
SUT PROLENE 4 0 PS 2 18 (SUTURE) ×2 IMPLANT
SWAB COLLECTION DEVICE MRSA (MISCELLANEOUS) IMPLANT
SWAB CULTURE ESWAB REG 1ML (MISCELLANEOUS) IMPLANT
SYR CONTROL 10ML LL (SYRINGE) ×3 IMPLANT

## 2018-09-20 NOTE — Anesthesia Procedure Notes (Signed)
Procedure Name: Intubation Performed by: Hedy Garro J, CRNA Pre-anesthesia Checklist: Patient identified, Emergency Drugs available, Suction available, Patient being monitored and Timeout performed Patient Re-evaluated:Patient Re-evaluated prior to induction Oxygen Delivery Method: Circle system utilized Preoxygenation: Pre-oxygenation with 100% oxygen Induction Type: IV induction and Rapid sequence Laryngoscope Size: Mac and 4 Grade View: Grade I Tube type: Oral Tube size: 7.5 mm Number of attempts: 1 Airway Equipment and Method: Stylet Placement Confirmation: ETT inserted through vocal cords under direct vision,  positive ETCO2 and breath sounds checked- equal and bilateral Secured at: 23 cm Tube secured with: Tape Dental Injury: Teeth and Oropharynx as per pre-operative assessment        

## 2018-09-20 NOTE — Transfer of Care (Signed)
Immediate Anesthesia Transfer of Care Note  Patient: Jackson Cox  Procedure(s) Performed: irrigation and debridement right middle finger subcutaneous tissie and bone complex wound closure (Right Finger)  Patient Location: PACU  Anesthesia Type:General  Level of Consciousness: sedated, patient cooperative and responds to stimulation  Airway & Oxygen Therapy: Patient Spontanous Breathing and Patient connected to face mask oxygen  Post-op Assessment: Report given to RN and Post -op Vital signs reviewed and stable  Post vital signs: Reviewed and stable  Last Vitals:  Vitals Value Taken Time  BP    Temp    Pulse    Resp    SpO2      Last Pain:  Vitals:   09/20/18 1925  TempSrc: Oral  PainSc:          Complications: No apparent anesthesia complications

## 2018-09-20 NOTE — Discharge Instructions (Addendum)
Discharge Instructions ° °- Keep dressings in place. Do not remove them. °- The dressings must stay dry °- Take all medication as prescribed. Transition to over the counter pain medication as your pain improves °- Keep the hand elevated over the next 48-72 hours to help with pain and swelling °- Move all digits not restricted by the dressings regularly to prevent stiffness °- Please call to schedule a follow up appointment with Dr. Creighton at [phone] for 10-14 days following surgery °

## 2018-09-20 NOTE — ED Triage Notes (Signed)
Patient here from home with complaints of right middle finger laceration after getting it stuck in a saw. Tetnus shot unknown. Bleeding controlled. Denies blood thinners.

## 2018-09-20 NOTE — Op Note (Signed)
PREOPERATIVE DIAGNOSIS: Right middle finger tablesaw laceration  POSTOPERATIVE DIAGNOSIS: Same  ATTENDING PHYSICIAN: Gasper Lloyd. Roney Mans, III, MD who was present and scrubbed for the entire case   ASSISTANT SURGEON: None.   ANESTHESIA: General with digital block  SURGICAL PROCEDURES: 1.  Irrigation debridement of skin, subcutaneous tissue and bone to the tip of the right middle finger 2.  Complex wound closure to the tip of the middle finger  SURGICAL INDICATIONS: Patient is a 33 year old male who struck his right middle finger a table saw earlier today.  He was found to have a complex wound to the tip of the finger.  I discussed with him treatment options and he did agree to proceed with operative closure of the wound and surgery is indicated  FINDING: Complex laceration to the tip of the middle finger primarily along the ulnar aspect with a portion of skin loss.  The radial flap was intact.  There is a small amount of nailbed involved as well primarily at the most distal aspect of it.  Tension-free closure of the wound was achieved utilizing the intact radial flap.  DESCRIPTION OF PROCEDURE: Patient was identified in the preoperative holding area where the risk benefits and alternatives of the procedure were discussed with patient.  These include but are not limited to infection, bleeding, damage to surrounding structures including blood vessels and nerves, pain, stiffness, nail deformity and need for additional procedures.  Informed consent was obtained at that time the patient's right middle finger was marked with surgical marking pen.  He was then brought back to operative suite where a timeout was performed identifying the correct patient operative site.  He was positioned supine operative table with his hand outstretched on a hand table.  He was induced under general anesthesia and tourniquet was placed on the upper arm.  The arm was then prepped and draped in usual sterile fashion.  A  Penrose drain was placed under tension around the base of the finger to act as a finger tourniquet.  Attention was turned to the tip of the digit.  There is a complex laceration to the fingertip with skin and soft tissue defect along the ulnar aspect of the pulp.  There is a flap of skin radially that was intact.  The ulnar distal portion of the nailbed was also lacerated with a defect of this as well.  The small portion of cut nail plate was removed.  The wound was then copiously irrigated with normal saline.  Skin, subcutaneous tissue and bone were then debrided sharply using scissors as well as with a curette.  There is no significant gross contamination.  The radial flap was then mobilized gently to reach and cover the tip of the finger.  It was sutured in place in a tension-free manner using interrupted 4-0 Prolene's.  That radial flap was also then sewn to the intact nailbed with 5-0 plain gut sutures.  Jagged edges of the skin were removed sharply to allow for smooth contour to the fingertip.  Xeroform, 4 x 4's, Kling, a tip protector splint and Coban were then applied to the finger after the tourniquet was released.  The patient was awoken from his anesthesia next bed in the operating room without any complications.  Is taken to the PACU in stable condition.  ESTIMATED BLOOD LOSS: Less than 10 mL's  TOURNIQUET TIME: 25 minutes  SPECIMENS: None  POSTOPERATIVE PLAN: The patient will be discharged home and seen back  in the office in approximately 10-12  days for wound check, suture  removal, and then be sent to a therapist for wound care, edema control and early range of motion  IMPLANTS: None

## 2018-09-20 NOTE — ED Notes (Signed)
Jackson Cox [phone]

## 2018-09-20 NOTE — ED Provider Notes (Signed)
[facility]-EMERGENCY DEPT Provider Note   CSN: [id number] Arrival date & time: 09/20/18  1914    History   Chief Complaint Chief Complaint  Patient presents with  . Finger Injury  . Laceration    HPI Jackson Cox is a 33 y.o. male presenting for evaluation of finger laceration.  Patient states was prior to arrival he was using a table saw when his right middle finger got caught in the saw.  Patient reports persistent bleeding since.  He reports pain at the site, and numbness on the ulnar side.  He denies injury elsewhere.  He does not know when his last tetanus shot was.  He has no other medical problems, takes no medications daily.  He is not on blood thinners.     HPI  History reviewed. No pertinent past medical history.  Patient Active Problem List   Diagnosis Date Noted  . Physical exam 01/27/2015  . Right wrist pain 11/18/2014  . Allergic rhinitis 07/29/2014  . Bilateral hand pain 07/29/2014    Past Surgical History:  Procedure Laterality Date  . TONSILLECTOMY          Home Medications    Prior to Admission medications   Medication Sig Start Date End Date Taking? Authorizing Provider  cetirizine (ZYRTEC) 10 MG tablet Take 10 mg by mouth at bedtime.    Yes [provider]  milk thistle 175 MG tablet Take 175 mg by mouth at bedtime.   Yes [provider]  Multiple Vitamins-Minerals (MULTIVITAMIN ADULT PO) Take 1 tablet by mouth every evening.    Yes [provider]  pseudoephedrine (SUDAFED) 30 MG tablet Take 30 mg by mouth at bedtime as needed for congestion.   Yes [provider]  fluticasone (FLONASE) 50 MCG/ACT nasal spray Place 2 sprays into both nostrils daily. Patient not taking: Reported on 09/20/2018 11/18/14   Sheliah Hatchabori, Katherine E, MD    Family History Family History  Problem Relation Age of Onset  . Hypertension Father   . Diabetes Father     Social History Social History    Tobacco Use  . Smoking status: Never Smoker  . Smokeless tobacco: Never Used  Substance Use Topics  . Alcohol use: Yes    Alcohol/week: 0.0 standard drinks    Comment: occasionally  . Drug use: No     Allergies   Patient has no known allergies.   Review of Systems Review of Systems  Skin: Positive for wound.  Hematological: Does not bruise/bleed easily.  All other systems reviewed and are negative.    Physical Exam Updated Vital Signs BP (!) 147/57 (BP Location: Left Arm)   Pulse 86   Temp 98 F (36.7 C) (Oral)   Resp 18   SpO2 100%   Physical Exam Vitals signs and nursing note reviewed.  Constitutional:      General: He is not in acute distress.    Appearance: He is well-developed.  HENT:     Head: Normocephalic and atraumatic.  Neck:     Musculoskeletal: Normal range of motion.  Pulmonary:     Effort: Pulmonary effort is normal.  Abdominal:     General: There is no distension.  Musculoskeletal: Normal range of motion.        General: Tenderness and signs of injury present.     Comments: Full active ROM of the middle finger with pain. See pictures below.  Mangled  Wound of the middle finger from mid nail to mid  palmar pad wrapping around the radial side. No obvious tendon involvement.   Skin:    General: Skin is warm.     Capillary Refill: Capillary refill takes less than 2 seconds.     Findings: No rash.  Neurological:     Mental Status: He is alert and oriented to person, place, and time.               ED Treatments / Results  Labs (all labs ordered are listed, but only abnormal results are displayed) Labs Reviewed  BASIC METABOLIC PANEL - Abnormal; Notable for the following components:      Result Value   Glucose, Bld 110 (*)    Creatinine, Ser 1.41 (*)    All other components within normal limits  CBC    EKG None  Radiology Dg Finger Middle Right  Result Date: 09/20/2018 CLINICAL DATA:  Patient here from home with complaints  of right middle finger laceration after getting it stuck in a saw tonight. EXAM: RIGHT MIDDLE FINGER 2+V COMPARISON:  None. FINDINGS: Overlying bandages in place. No osseous fracture or dislocation. IMPRESSION: No osseous fracture or dislocation. Electronically Signed   By: Bary Richard M.D.   On: 09/20/2018 20:24    Procedures Procedures (including critical care time)  Medications Ordered in ED Medications  ceFAZolin (ANCEF) IVPB 1 g/50 mL premix (1 g Intravenous New Bag/Given 09/20/18 2040)  Tdap (BOOSTRIX) injection 0.5 mL (0.5 mLs Intramuscular Given 09/20/18 2042)  morphine 4 MG/ML injection 4 mg (4 mg Intravenous Given 09/20/18 2043)     Initial Impression / Assessment and Plan / ED Course  I have reviewed the triage vital signs and the nursing notes.  Pertinent labs & imaging results that were available during my care of the patient were reviewed by me and considered in my medical decision making (see chart for details).        Resenting for evaluation of finger injury.  Physical exam shows significant soft tissue injury in the right middle finger.  Concern for possible bony injury.  Will order x-ray evaluation.  Tetanus antibiotics pending.  X-ray reviewed by me, no fracture dislocation.  Due to the extent of the injury, will consult with hand for further evaluation.  Discussed with Dr. Roney Mans from hand surgery, who reviewed x-ray and images.  Recommends patient go to the OR for repair.  Discussed findings and plan with patient, who is agreeable.   Pt transferred to OR in stable condition.   Final Clinical Impressions(s) / ED Diagnoses   Final diagnoses:  Laceration of right middle finger without foreign body with damage to nail, initial encounter    ED Discharge Orders    None       Alveria Apley, PA-C 09/20/18 2043    Maia Plan, MD 09/21/18 1317

## 2018-09-20 NOTE — H&P (Signed)
Reason for Consult: Right middle finger tablesaw injury Referring Physician: Alveria Apleyaccavale, Sophia, PA-C  Jackson Cox is an 33 y.o. male.  HPI: Patient is a 33 year old male who presented to the ER following a injury to the right middle finger by a table saw.  He states he was cutting a of wood when his shin caught the song pulled his hand into the saw.  He sustained a laceration to the tip of the right middle finger.  He was seen and evaluated by the ER where his wound was dressed and x-rays were obtained.  This showed a soft tissue injury about the distal phalanx.  I was subsequently called for further treatment options and recommendations.  He does have subjective decrease sensation to the remaining tip of the digit.  He has no injuries to the manger of the hand.  History reviewed. No pertinent past medical history.  Past Surgical History:  Procedure Laterality Date  . TONSILLECTOMY      Family History  Problem Relation Age of Onset  . Hypertension Father   . Diabetes Father     Social History:  reports that he has never smoked. He has never used smokeless tobacco. He reports current alcohol use. He reports that he does not use drugs.  Allergies: No Known Allergies  Medications: I have reviewed the patient's current medications.  Results for orders placed or performed during the hospital encounter of 09/20/18 (from the past 48 hour(s))  CBC     Status: None   Collection Time: 09/20/18  7:57 PM  Result Value Ref Range   WBC 7.7 4.0 - 10.5 K/uL   RBC 4.66 4.22 - 5.81 MIL/uL   Hemoglobin 14.4 13.0 - 17.0 g/dL   HCT 16.144.3 09.639.0 - 04.552.0 %   MCV 95.1 80.0 - 100.0 fL   MCH 30.9 26.0 - 34.0 pg   MCHC 32.5 30.0 - 36.0 g/dL   RDW 40.911.9 81.111.5 - 91.415.5 %   Platelets 255 150 - 400 K/uL   nRBC 0.0 0.0 - 0.2 %    Comment: Performed at [facility], [address] [place], [place] [address]  Basic metabolic panel     Status: Abnormal   Collection Time: 09/20/18  7:57 PM   Result Value Ref Range   Sodium 138 135 - 145 mmol/L   Potassium 4.0 3.5 - 5.1 mmol/L   Chloride 101 98 - 111 mmol/L   CO2 27 22 - 32 mmol/L   Glucose, Bld 110 (H) 70 - 99 mg/dL   BUN 17 6 - 20 mg/dL   Creatinine, Ser 6.211.41 (H) 0.61 - 1.24 mg/dL   Calcium 9.3 8.9 - 30.810.3 mg/dL   GFR calc non Af Amer >60 >60 mL/min   GFR calc Af Amer >60 >60 mL/min   Anion gap 10 5 - 15    Comment: Performed at [facility], [address] [place], [place] [address]    Dg Finger Middle Right  Result Date: 09/20/2018 CLINICAL DATA:  Patient here from home with complaints of right middle finger laceration after getting it stuck in a saw tonight. EXAM: RIGHT MIDDLE FINGER 2+V COMPARISON:  None. FINDINGS: Overlying bandages in place. No osseous fracture or dislocation. IMPRESSION: No osseous fracture or dislocation. Electronically Signed   By: Bary RichardStan  Maynard M.D.   On: 09/20/2018 20:24    Review of Systems  Musculoskeletal: Positive for joint pain.  All other systems reviewed and are negative.  Blood pressure (!) 147/57,  pulse 86, temperature 98 F (36.7 C), temperature source Oral, resp. rate 18, SpO2 100 %.  General: Patient is alert and oriented x3 no acute distress.  Affect is appropriate. Respiration: Symmetric and nonlabored. Cardiovascular: Regular rate and rhythm Musculoskeletal: Examination of the right upper extremity shows a complex laceration to the tip of the right middle finger consistent with a table saw injury.  There is no surrounding erythema, lymphadenopathy, swelling or signs of infection.  There are no other wounds to the right hand.  There is nailbed and plate involvement.  He has intact flexion extension to the remaining portion of the digit.  The proximal digit is warm and well-perfused with brisk capillary refill.  His sensation is intact to light touch to both radial and ulnar aspects of the digit proximal to the laceration.  He does have subjective decrease  sensation to the intact skin distal to the laceration.  Assessment/Plan: #1 right middle finger complex laceration from table saw  I had a long discussion with the patient today regarding his right middle finger.  I do recommend proceeding forward to surgery for irrigation debridement, possible wound closure versus revision amputation of the tip of the finger.  The risk and benefits of this were discussed with the patient which include but are not limited to infection, bleeding, damage to surrounding structures including blood vessels nerves, pain, stiffness and need for additional procedures.  Informed consent was obtained.  I will plan to discharge him home following the procedure and can see him back in clinic proximally 10 to 14 days following procedure.  I will send him home with pain medication as well as oral antibiotics.  All his questions were answered and he was happy with this plan.  Jackson Cox 09/20/2018, 9:07 PM

## 2018-09-20 NOTE — ED Notes (Signed)
Bed: WA24 Expected date:  Expected time:  Means of arrival:  Comments: 

## 2018-09-20 NOTE — Anesthesia Preprocedure Evaluation (Signed)
Anesthesia Evaluation  Patient identified by MRN, date of birth, ID band Patient awake    Reviewed: Allergy & Precautions, NPO status , Patient's Chart, lab work & pertinent test results  Airway Mallampati: II  TM Distance: >3 FB Neck ROM: Full    Dental no notable dental hx.    Pulmonary neg pulmonary ROS,    Pulmonary exam normal breath sounds clear to auscultation       Cardiovascular negative cardio ROS Normal cardiovascular exam Rhythm:Regular Rate:Normal     Neuro/Psych negative neurological ROS  negative psych ROS   GI/Hepatic negative GI ROS, Neg liver ROS,   Endo/Other  negative endocrine ROS  Renal/GU negative Renal ROS  negative genitourinary   Musculoskeletal negative musculoskeletal ROS (+)   Abdominal   Peds negative pediatric ROS (+)  Hematology negative hematology ROS (+)   Anesthesia Other Findings   Reproductive/Obstetrics negative OB ROS                             Anesthesia Physical Anesthesia Plan  ASA: I and emergent  Anesthesia Plan: General   Post-op Pain Management:    Induction: Intravenous and Rapid sequence  PONV Risk Score and Plan: 2 and Ondansetron, Dexamethasone and Treatment may vary due to age or medical condition  Airway Management Planned: Oral ETT  Additional Equipment:   Intra-op Plan:   Post-operative Plan: Extubation in OR  Informed Consent: I have reviewed the patients History and Physical, chart, labs and discussed the procedure including the risks, benefits and alternatives for the proposed anesthesia with the patient or authorized representative who has indicated his/her understanding and acceptance.     Dental advisory given  Plan Discussed with: CRNA and Surgeon  Anesthesia Plan Comments:         Anesthesia Quick Evaluation

## 2018-09-21 NOTE — Anesthesia Postprocedure Evaluation (Signed)
Anesthesia Post Note  Patient: Jackson Cox  Procedure(s) Performed: irrigation and debridement right middle finger subcutaneous tissie and bone complex wound closure (Right Finger)     Patient location during evaluation: PACU Anesthesia Type: General Level of consciousness: awake and alert Pain management: pain level controlled Vital Signs Assessment: post-procedure vital signs reviewed and stable Respiratory status: spontaneous breathing, nonlabored ventilation, respiratory function stable and patient connected to nasal cannula oxygen Cardiovascular status: blood pressure returned to baseline and stable Postop Assessment: no apparent nausea or vomiting Anesthetic complications: no    Last Vitals:  Vitals:   09/20/18 2300 09/20/18 2315  BP: 131/78 139/79  Pulse: 75 86  Resp: 13 15  Temp:  (!) 36.3 C  SpO2: 98% 99%    Last Pain:  Vitals:   09/20/18 2315  TempSrc:   PainSc: 0-No pain                 Katarina Riebe S

## 2018-09-22 ENCOUNTER — Encounter (HOSPITAL_COMMUNITY): Payer: Self-pay | Admitting: Orthopaedic Surgery

## 2019-08-16 IMAGING — CR RIGHT MIDDLE FINGER 2+V
3 series · 3 of 3 positions shown · non-contrast
Comparison: None.

CLINICAL DATA: Patient here from home with complaints of right
middle finger laceration after getting it stuck in a saw tonight.

EXAM:
RIGHT MIDDLE FINGER 2+V

[x finger pa right]
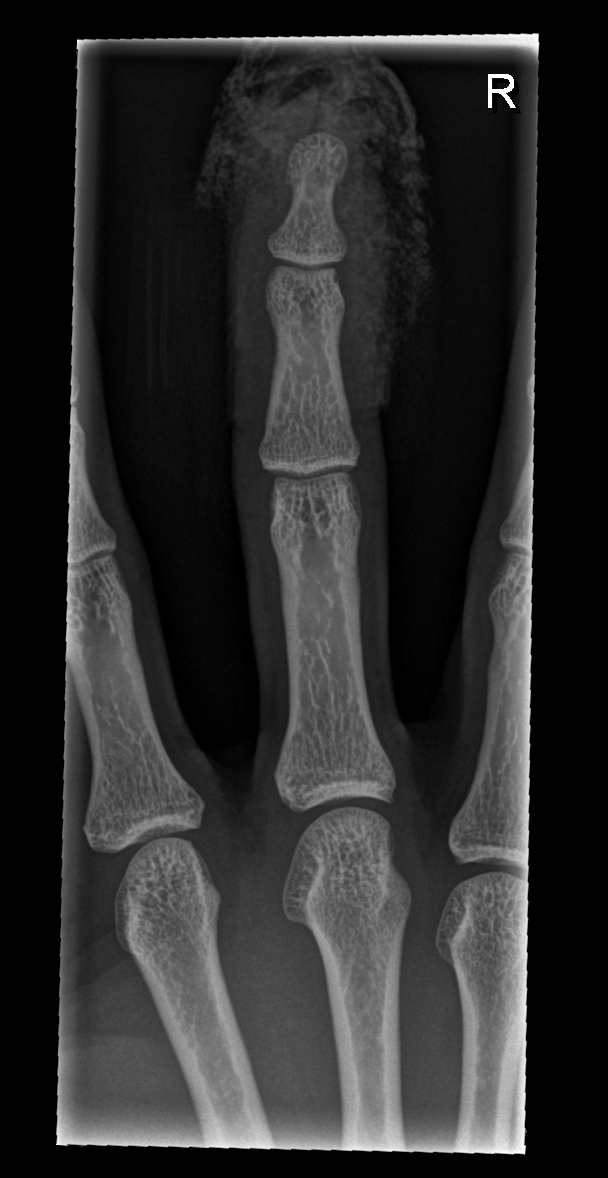

[x finger obl right]
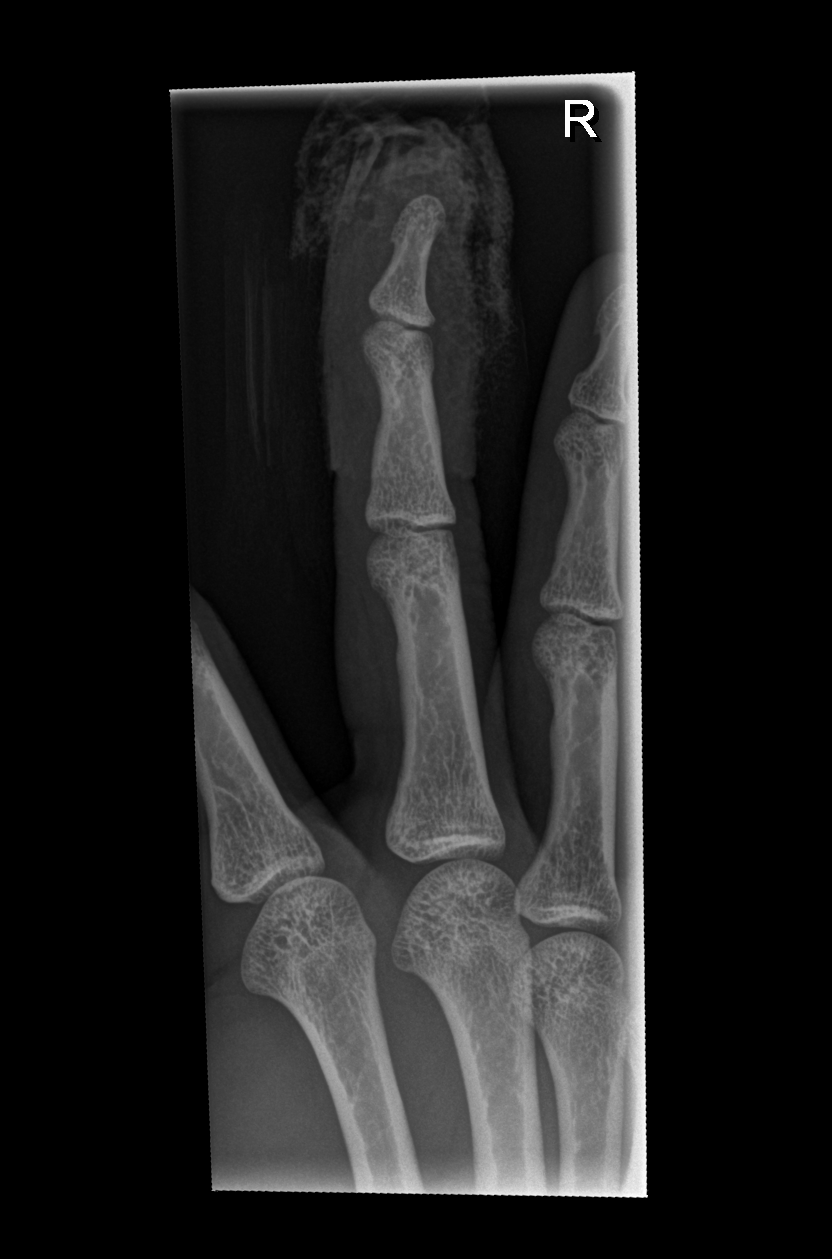

[x finger lat right]
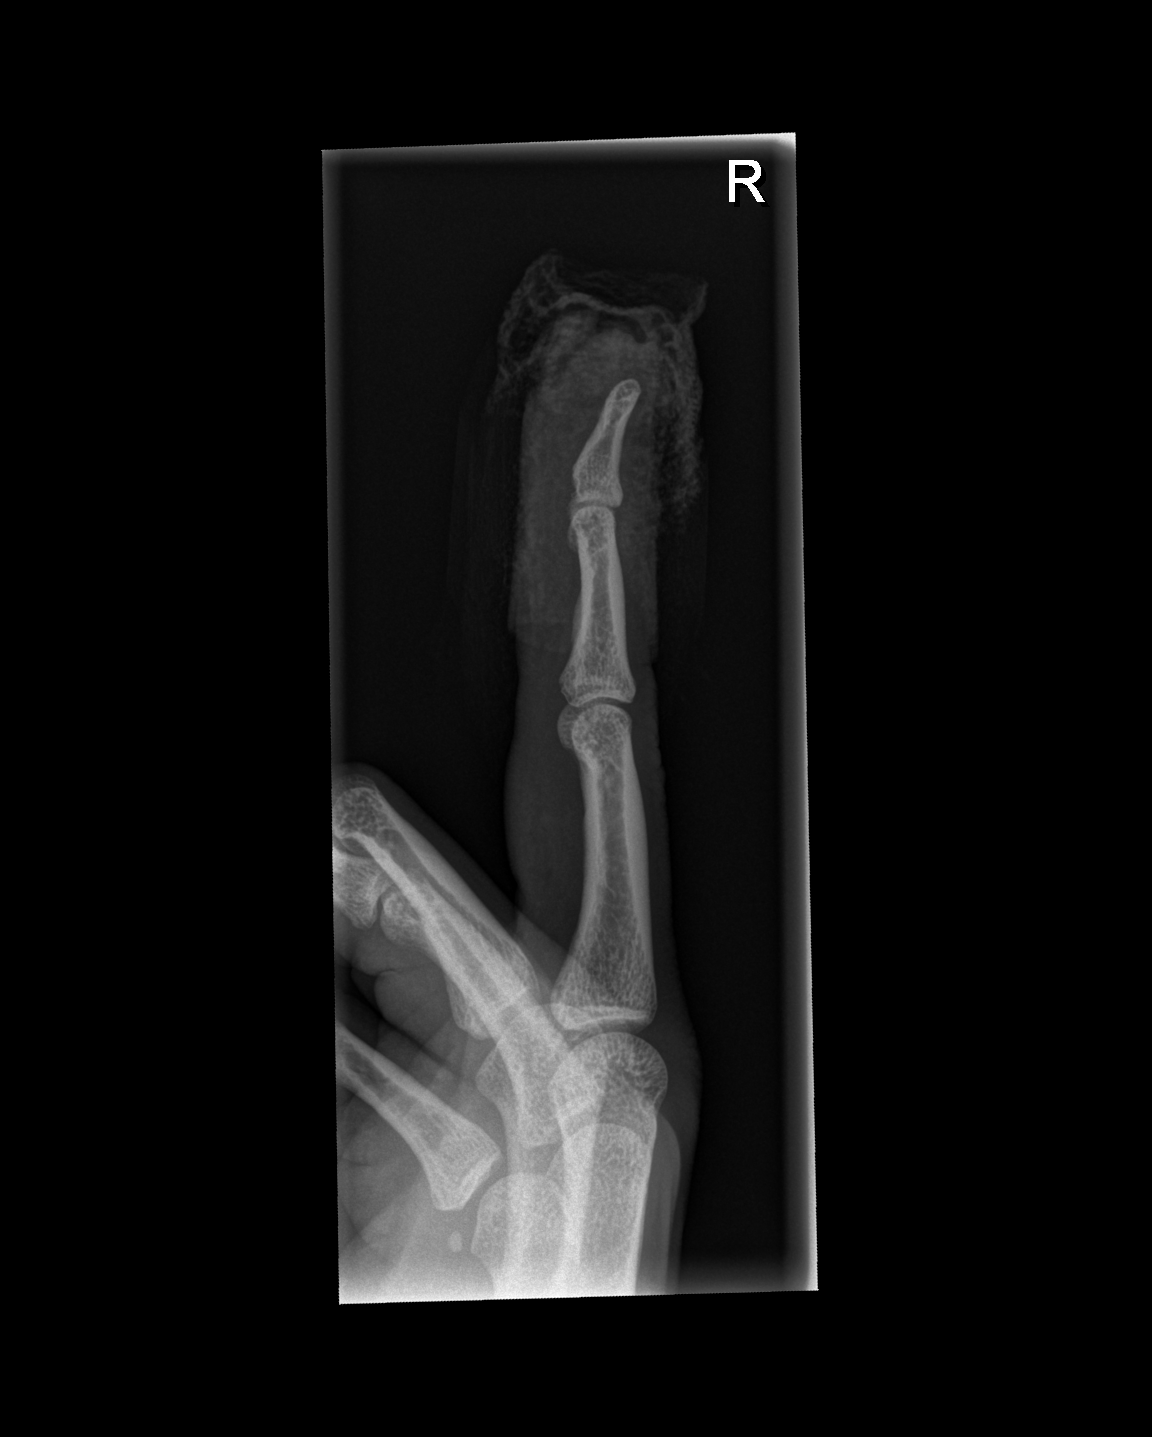

[3 of 3 positions shown; findings below may reference images not displayed]

FINDINGS: Overlying bandages in place. No osseous fracture or dislocation.
IMPRESSION: No osseous fracture or dislocation.

## 2022-08-27 ENCOUNTER — Telehealth: Payer: Self-pay

## 2022-08-27 NOTE — Telephone Encounter (Signed)
Patient would like to re-establish with Dr.Tabori. Is it okay to schedule patient.

## 2022-08-28 NOTE — Telephone Encounter (Signed)
Ok to establish 

## 2022-08-28 NOTE — Telephone Encounter (Signed)
Per Dr Birdie Riddle ok to est

## 2022-08-29 NOTE — Telephone Encounter (Signed)
FYI: called Patient no answer,LM letting him know he is okay to re-establish with DrTabori.

## 2022-09-24 ENCOUNTER — Telehealth: Payer: Self-pay

## 2022-09-24 NOTE — Telephone Encounter (Signed)
This was approved last month.  Ok to schedule

## 2022-09-24 NOTE — Telephone Encounter (Signed)
Dr Beverely Low approved pt

## 2022-10-18 ENCOUNTER — Encounter: Payer: Self-pay | Admitting: Family Medicine

## 2022-10-18 ENCOUNTER — Ambulatory Visit (INDEPENDENT_AMBULATORY_CARE_PROVIDER_SITE_OTHER): Payer: No Typology Code available for payment source | Admitting: Family Medicine

## 2022-10-18 VITALS — BP 124/60 | HR 109 | Temp 98.2°F | Resp 18 | Ht 72.0 in | Wt 215.4 lb

## 2022-10-18 DIAGNOSIS — Z0000 Encounter for general adult medical examination without abnormal findings: Secondary | ICD-10-CM | POA: Diagnosis not present

## 2022-10-18 DIAGNOSIS — Z3009 Encounter for other general counseling and advice on contraception: Secondary | ICD-10-CM

## 2022-10-18 DIAGNOSIS — E663 Overweight: Secondary | ICD-10-CM | POA: Diagnosis not present

## 2022-10-18 LAB — CBC WITH DIFFERENTIAL/PLATELET
Basophils Absolute: 0 10*3/uL (ref 0.0–0.1)
Basophils Relative: 0.7 % (ref 0.0–3.0)
Eosinophils Absolute: 0.1 10*3/uL (ref 0.0–0.7)
Eosinophils Relative: 1.4 % (ref 0.0–5.0)
HCT: 42.7 % (ref 39.0–52.0)
Hemoglobin: 14.3 g/dL (ref 13.0–17.0)
Lymphocytes Relative: 50.8 % — ABNORMAL HIGH (ref 12.0–46.0)
Lymphs Abs: 2.3 10*3/uL (ref 0.7–4.0)
MCHC: 33.6 g/dL (ref 30.0–36.0)
MCV: 93.2 fl (ref 78.0–100.0)
Monocytes Absolute: 0.4 10*3/uL (ref 0.1–1.0)
Monocytes Relative: 8.8 % (ref 3.0–12.0)
Neutro Abs: 1.8 10*3/uL (ref 1.4–7.7)
Neutrophils Relative %: 38.3 % — ABNORMAL LOW (ref 43.0–77.0)
Platelets: 292 10*3/uL (ref 150.0–400.0)
RBC: 4.58 Mil/uL (ref 4.22–5.81)
RDW: 12.7 % (ref 11.5–15.5)
WBC: 4.6 10*3/uL (ref 4.0–10.5)

## 2022-10-18 LAB — LIPID PANEL
Cholesterol: 171 mg/dL (ref 0–200)
HDL: 39.2 mg/dL (ref >39.00)
LDL Cholesterol: 113 mg/dL — ABNORMAL HIGH (ref 0–99)
NonHDL: 132.27
Total CHOL/HDL Ratio: 4
Triglycerides: 94 mg/dL (ref 0.0–149.0)
VLDL: 18.8 mg/dL (ref 0.0–40.0)

## 2022-10-18 LAB — HEPATIC FUNCTION PANEL
ALT: 27 U/L (ref 0–53)
AST: 32 U/L (ref 0–37)
Albumin: 4.8 g/dL (ref 3.5–5.2)
Alkaline Phosphatase: 79 U/L (ref 39–117)
Bilirubin, Direct: 0.1 mg/dL (ref 0.0–0.3)
Total Bilirubin: 0.5 mg/dL (ref 0.2–1.2)
Total Protein: 7.7 g/dL (ref 6.0–8.3)

## 2022-10-18 LAB — BASIC METABOLIC PANEL
BUN: 14 mg/dL (ref 6–23)
CO2: 30 mEq/L (ref 19–32)
Calcium: 9.8 mg/dL (ref 8.4–10.5)
Chloride: 101 mEq/L (ref 96–112)
Creatinine, Ser: 1.22 mg/dL (ref 0.40–1.50)
GFR: 75.42 mL/min (ref >60.00)
Glucose, Bld: 78 mg/dL (ref 70–99)
Potassium: 4.3 mEq/L (ref 3.5–5.1)
Sodium: 139 mEq/L (ref 135–145)

## 2022-10-18 LAB — TSH: TSH: 0.98 u[IU]/mL (ref 0.35–5.50)

## 2022-10-18 NOTE — Patient Instructions (Signed)
Follow up in 1 year or as needed We'll notify you of your lab results and make any changes if needed We'll call you to schedule your Urology appt for vasectomy consultation Keep up the good work!  You look great! Call with any questions or concerns Have a great summer!!

## 2022-10-18 NOTE — Progress Notes (Signed)
   Subjective:    Patient ID: Jackson Cox, male    DOB: 12/28/1985, 37 y.o.   MRN: [id number]  HPI Here today to re-establish.  CPE- UTD on Tdap.   Review of Systems Patient reports no vision/hearing changes, anorexia, fever ,adenopathy, persistant/recurrent hoarseness, swallowing issues, chest pain, palpitations, edema, persistant/recurrent cough, hemoptysis, dyspnea (rest,exertional, paroxysmal nocturnal), gastrointestinal  bleeding (melena, rectal bleeding), abdominal pain, excessive heart burn, GU symptoms (dysuria, hematuria, voiding/incontinence issues) syncope, focal weakness, memory loss, numbness & tingling, skin/hair/nail changes, depression, anxiety, abnormal bruising/bleeding, musculoskeletal symptoms/signs.     Objective:   Physical Exam General Appearance:    Alert, cooperative, no distress, appears stated age  Head:    Normocephalic, without obvious abnormality, atraumatic  Eyes:    PERRL, conjunctiva/corneas clear, EOM's intact both eyes       Ears:    Normal TM's and external ear canals, both ears  Nose:   Nares normal, septum midline, mucosa normal, no drainage   or sinus tenderness  Throat:   Lips, mucosa, and tongue normal; teeth and gums normal  Neck:   Supple, symmetrical, trachea midline, no adenopathy;       thyroid:  No enlargement/tenderness/nodules  Back:     Symmetric, no curvature, ROM normal, no CVA tenderness  Lungs:     Clear to auscultation bilaterally, respirations unlabored  Chest wall:    No tenderness or deformity  Heart:    Regular rate and rhythm, S1 and S2 normal, no murmur, rub   or gallop  Abdomen:     Soft, non-tender, bowel sounds active all four quadrants,    no masses, no organomegaly  Genitalia:    deferred  Rectal:    Extremities:   Extremities normal, atraumatic, no cyanosis or edema  Pulses:   2+ and symmetric all extremities  Skin:   Skin color, texture, turgor normal, no rashes or lesions  Lymph nodes:   Cervical,  supraclavicular, and axillary nodes normal  Neurologic:   CNII-XII intact. Normal strength, sensation and reflexes      throughout          Assessment & Plan:

## 2022-10-18 NOTE — Assessment & Plan Note (Signed)
Pt's PE WNL.  UTD on Tdap.  Desires referral for vasectomy.  Check labs.  Anticipatory guidance provided.

## 2022-10-22 ENCOUNTER — Telehealth: Payer: Self-pay

## 2022-10-22 NOTE — Telephone Encounter (Signed)
Pt aware of lab results 

## 2022-10-22 NOTE — Telephone Encounter (Signed)
-----   Message from Sheliah Hatch, MD sent at 10/22/2022  8:37 AM EDT ----- Labs look great!  No changes at this time

## 2022-11-01 ENCOUNTER — Encounter: Payer: Self-pay | Admitting: Urology

## 2022-11-01 ENCOUNTER — Ambulatory Visit: Payer: No Typology Code available for payment source | Admitting: Urology

## 2022-11-01 ENCOUNTER — Telehealth: Payer: Self-pay | Admitting: Urology

## 2022-11-01 VITALS — BP 160/73 | HR 69 | Ht 72.0 in | Wt 215.0 lb

## 2022-11-01 DIAGNOSIS — Z3009 Encounter for other general counseling and advice on contraception: Secondary | ICD-10-CM | POA: Diagnosis not present

## 2022-11-01 MED ORDER — ALPRAZOLAM 1 MG PO TABS: ORAL_TABLET | ORAL | 0 refills | Status: AC

## 2022-11-01 NOTE — Patient Instructions (Signed)

## 2022-11-01 NOTE — Telephone Encounter (Signed)
Patient has been scheduled for Vasectomy on 12/05/2022 with Dr Pete Glatter. Instructions given to patient.

## 2022-11-01 NOTE — Progress Notes (Signed)
Assessment: 1. Encounter for vasectomy assessment     Plan: Schedule for vasectomy per patient request Rx for alprazolam 1 mg pre-procedure provided.   Chief Complaint:  Chief Complaint  Patient presents with   VAS Consult    History of Present Illness:  Jackson Cox is a 37 y.o. male who is seen for vasectomy evaluation. He is married with 3 children.  No history of scrotal trauma or infection.  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   Past Medical History:  No past medical history on file.  Past Surgical History:  Past Surgical History:  Procedure Laterality Date   AMPUTATION Right 09/20/2018   Procedure: irrigation and debridement right middle finger subcutaneous tissie and bone complex wound closure;  Surgeon: Ernest Mallick, MD;  Location: [facility];  Service: Orthopedics;  Laterality: Right;   TONSILLECTOMY      Allergies:  No Known Allergies  Family History:  Family History  Problem Relation Age of Onset   Hypertension Father    Diabetes Father     Social History:  Social History   Tobacco Use   Smoking status: Never   Smokeless tobacco: Never  Substance Use Topics   Alcohol use: Yes    Alcohol/week: 0.0 standard drinks of alcohol    Comment: occasionally   Drug use: No    Review of symptoms:  Constitutional:  Negative for unexplained weight loss, night sweats, fever, chills ENT:  Negative for nose bleeds, sinus pain, painful swallowing CV:  Negative for chest pain, shortness of breath, exercise intolerance, palpitations, loss of consciousness Resp:  Negative for cough, wheezing, shortness of breath GI:  Negative for nausea, vomiting, diarrhea, bloody stools GU:  Positives noted in HPI; otherwise negative for gross hematuria, dysuria, urinary incontinence Neuro:  Negative for seizures, poor balance, limb weakness, slurred speech Psych:  Negative for lack of energy, depression, anxiety Endocrine:  Negative for polydipsia,  polyuria, symptoms of hypoglycemia (dizziness, hunger, sweating) Hematologic:  Negative for anemia, purpura, petechia, prolonged or excessive bleeding, use of anticoagulants  Allergic:  Negative for difficulty breathing or choking as a result of exposure to anything; no shellfish allergy; no allergic response (rash/itch) to materials, foods  Physical exam: BP (!) 160/73   Pulse 69   Ht 6' (1.829 m)   Wt 215 lb (97.5 kg)   BMI 29.16 kg/m  GENERAL APPEARANCE:  Well appearing, well developed, well nourished, NAD HEENT:  Atraumatic, normocephalic, oropharynx clear NECK:  Supple without lymphadenopathy or thyromegaly ABDOMEN:  Soft, non-tender, no masses EXTREMITIES:  Moves all extremities well, without clubbing, cyanosis, or edema NEUROLOGIC:  Alert and oriented x 3, normal gait, CN II-XII grossly intact MENTAL STATUS:  appropriate BACK:  Non-tender to palpation, No CVAT SKIN:  Warm, dry, and intact GU: Penis:  uncircumcised Meatus: Normal Scrotum: vas palpated bilaterally Testis: normal right testicle; small left testicle Epididymis: normal  Results: None  VASECTOMY CONSULTATION  Jackson Cox presents for vasectomy consultation today.  He is a 37 y.o. male, Married with 3  children .  He and his wife have discussed the issues regarding long-term fertility and are comfortable with this decision.  He presents for consideration for vasectomy.  I discussed the issues in detail with him today and he expressed no reservations.  As to the procedure, no scalpel technique vasectomy is explained and reviewed in detail.  Generalized risks including but not limited to bleeding, infection, orchalgia, testicular atrophy, epididymitis, scrotal hematoma, and chronic pain are  discussed.   Additionally, he understands that the possibility of vas recanalization following vasectomy is possible although rare.  Most importantly, the patient understands that he is not sterile initially and will need a  semen analysis check to confirm sterility such that no sperm are seen.  He is advised to avoid ejaculation for 10 days following the procedure.  The initial semen analysis will be checked in approximately 12 weeks and in some patients, several months may be required for clearance of all sperm.  He reports a clear understanding of the need for continued birth control until sterility is confirmed.  Otherwise, general issues regarding local anesthesia, prep, alprazolam are discussed and he reports a clear understanding.

## 2022-11-02 NOTE — Telephone Encounter (Signed)
Spoke with Mardelle Matte at [facility] (REF# [id number]), pt has met deductible and OOP max. Pt is covered at 100%. Pt is aware of his financial responsibilities.

## 2022-12-05 ENCOUNTER — Ambulatory Visit (INDEPENDENT_AMBULATORY_CARE_PROVIDER_SITE_OTHER): Payer: No Typology Code available for payment source | Admitting: Urology

## 2022-12-05 VITALS — BP 131/73 | HR 76 | Ht 72.0 in | Wt 215.0 lb

## 2022-12-05 DIAGNOSIS — Z302 Encounter for sterilization: Secondary | ICD-10-CM | POA: Diagnosis not present

## 2022-12-05 MED ORDER — CEPHALEXIN 500 MG PO CAPS: 500.0000 mg | ORAL_CAPSULE | Freq: Three times a day (TID) | ORAL | 0 refills | Status: AC

## 2022-12-05 NOTE — Patient Instructions (Signed)

## 2022-12-05 NOTE — Progress Notes (Signed)
   Assessment: 1. Encounter for vasectomy     Plan: Post vasectomy instructions given Rx sent. Post vasectomy semen analysis in 12 weeks  Chief Complaint:  Chief Complaint  Patient presents with   VAS    History of Present Illness:  Jackson Cox is a 37 y.o. male who is seen for vasectomy. He is married with 3 children.  No history of scrotal trauma or infection.  He has left testicular atrophy.  Past Medical History:  No past medical history on file.  Past Surgical History:  Past Surgical History:  Procedure Laterality Date   AMPUTATION Right 09/20/2018   Procedure: irrigation and debridement right middle finger subcutaneous tissie and bone complex wound closure;  Surgeon: Ernest Mallick, MD;  Location: [facility];  Service: Orthopedics;  Laterality: Right;   TONSILLECTOMY      Allergies:  No Known Allergies  Family History:  Family History  Problem Relation Age of Onset   Hypertension Father    Diabetes Father     Social History:  Social History   Tobacco Use   Smoking status: Never   Smokeless tobacco: Never  Substance Use Topics   Alcohol use: Yes    Alcohol/week: 0.0 standard drinks of alcohol    Comment: occasionally   Drug use: No    ROS: Constitutional:  Negative for fever, chills, weight loss CV: Negative for chest pain, previous MI, hypertension Respiratory:  Negative for shortness of breath, wheezing, sleep apnea, frequent cough GI:  Negative for nausea, vomiting, bloody stool, GERD   Physical exam: BP 131/73   Pulse 76   Ht 6' (1.829 m)   Wt 215 lb (97.5 kg)   BMI 29.16 kg/m  GENERAL APPEARANCE:  Well appearing, well developed, well nourished, NAD HEENT:  Atraumatic, normocephalic, oropharynx clear NECK:  Supple without lymphadenopathy or thyromegaly ABDOMEN:  Soft, non-tender, no masses EXTREMITIES:  Moves all extremities well, without clubbing, cyanosis, or edema NEUROLOGIC:  Alert and oriented x 3, normal gait, CN  II-XII grossly intact MENTAL STATUS:  appropriate BACK:  Non-tender to palpation, No CVAT SKIN:  Warm, dry, and intact  Results: None  VASECTOMY PROCEDURE:  [name] presents for vasectomy following previous vasectomy consultation and permit is signed.  The patient's anterior scrotal wall is shaved and prepped with Betadine in standard sterile fashion.  1% lidocaine is used as local anesthetic in the scrotal and peri vasal tissue.  A standard median raphe punch incision is made and a no scalpel technique vasectomy is performed.  Bilateral vas are isolated from the peri vasal tissue and an approximately 1 cm segment of vas is excised.  Proximal and distal segments are internally cauterized with electric heat cautery. Interposition of perivasal tissue was performed.  Bilateral palpation confirms bilateral vasectomy defect and no significant bleeding or hematoma is identified.  Neosporin gauze dressing and a scrotal support are applied.    Disposition: Patient is discharged home with Rx for pain medication and antibiotics.  Patient is given routine vasectomy instructions.   Most importantly, he is instructed and cautioned again regarding the need for protected intercourse until such time that a single  negative semen analysis has been obtained.  The initial semen analysis will be checked in approximately 12  weeks.  The patient reports a clear understanding.  He will call with any interval questions or concerns.

## 2023-02-21 ENCOUNTER — Other Ambulatory Visit: Payer: Self-pay

## 2023-02-21 DIAGNOSIS — Z302 Encounter for sterilization: Secondary | ICD-10-CM

## 2023-03-07 ENCOUNTER — Other Ambulatory Visit: Payer: No Typology Code available for payment source

## 2023-03-07 DIAGNOSIS — Z302 Encounter for sterilization: Secondary | ICD-10-CM

## 2023-03-08 LAB — POST-VAS SPERM EVALUATION,QUAL: Volume: 1.8 mL

## 2023-07-30 ENCOUNTER — Ambulatory Visit: Payer: Self-pay | Admitting: Family Medicine

## 2023-07-30 NOTE — Telephone Encounter (Signed)
 FYI about patient care, please advise

## 2023-07-30 NOTE — Telephone Encounter (Signed)
  Chief Complaint: urinary issues Symptoms: discomfort in tip of penis Frequency: random-comes and goes  Disposition: [] ED /[] Urgent Care (no appt availability in office) / [x] Appointment(In office/virtual)/ []  [facility]/ [] Home Care/ [] Refused Recommended Disposition /[] [facility]/ []  Follow-up with PCP Additional Notes: Pt has been dealing with urinary issues since 06/2023. Pt has been seen multiple times for UA/swabs for STD and all is negative. Pt has tried rounds of antibiotics and nothing has helped. Pt had a vesicotomy last year and wonders if this is cause.  PT stated he lifts weights and drinks lots of water. Pt has no trouble using restroom and denies pain. Pt states it is a dull pain in tip of penis at random times that comes and goes.It is not experienced daily. Pt is unaware of any triggers. Per protocol, pt to see provider within 2 weeks. Pt asked to be seen next week. Pt is seeing PCP 2/24 @1020 . RN gave care advice and pt verbalized understanding.               Copied from CRM [id number]. Topic: Clinical - Red Word Triage >> Jul 30, 2023  4:06 PM Jackson Cox wrote: Red Word that prompted transfer to Nurse Triage: Patient reports experiencing UTI symptoms (pain and discomfort in the genital area) on New Year's Day. The patient had a UTI test at urgent care, which was negative, but antibiotics were prescribed. The patient took the medication until bacterial results came back negative, and the medication was subsequently discontinued.  A week later, the symptoms returned, and the patient went to the health department for swabs. STI and UTI tests were negative. The health department prescribed different antibiotics, which the patient completed. While the medication provided some relief, the symptoms have now returned. Patient denies painful urination or burning, just pain and discomfort in genitals, history of vasectomy Reason for Disposition  All other urine  symptoms  Answer Assessment - Initial Assessment Questions 1. SYMPTOM: "What's the main symptom you're concerned about?" (e.g., frequency, incontinence)     Discomfort in tip  2. ONSET: "When did the  pain  start?"     New Years  3. PAIN: "Is there any pain?" If Yes, ask: "How bad is it?" (Scale: 1-10; mild, moderate, severe)     Not painful; more discomfort-3 4. CAUSE: "What do you think is causing the symptoms?"     Not sure  5. OTHER SYMPTOMS: "Do you have any other symptoms?" (e.g., blood in urine, fever, flank pain, pain with urination)     Groin sore  Protocols used: Urinary Symptoms-A-AH

## 2023-07-31 NOTE — Telephone Encounter (Signed)
 Noted.  Will see pt at upcoming appt

## 2023-08-05 ENCOUNTER — Encounter: Payer: Self-pay | Admitting: Family Medicine

## 2023-08-05 ENCOUNTER — Telehealth: Payer: Self-pay

## 2023-08-05 ENCOUNTER — Ambulatory Visit (INDEPENDENT_AMBULATORY_CARE_PROVIDER_SITE_OTHER): Payer: No Typology Code available for payment source | Admitting: Family Medicine

## 2023-08-05 VITALS — BP 110/68 | HR 85 | Temp 98.5°F | Ht 72.0 in | Wt 206.3 lb

## 2023-08-05 DIAGNOSIS — B029 Zoster without complications: Secondary | ICD-10-CM

## 2023-08-05 DIAGNOSIS — N4889 Other specified disorders of penis: Secondary | ICD-10-CM

## 2023-08-05 MED ORDER — VALACYCLOVIR HCL 1 G PO TABS: 1000.0000 mg | ORAL_TABLET | Freq: Three times a day (TID) | ORAL | 0 refills | Status: AC

## 2023-08-05 MED ORDER — NYSTATIN 100000 UNIT/GM EX CREA: 1.0000 | TOPICAL_CREAM | Freq: Two times a day (BID) | CUTANEOUS | 0 refills | Status: AC

## 2023-08-05 NOTE — Progress Notes (Unsigned)
   Subjective:    Patient ID: Jackson Cox, male    DOB: Jan 20, 1986, 38 y.o.   MRN: [id number]  HPI Penile pain- pt reports he developed urinary urgency on 06/12/23.  Went to [facility] for UTI.  Treated w/ abx despite negative UA/culture.  Sxs returned in 2 weeks.  [facility]ent to [facility] and had STD testing and repeat urine test that were all negative.  Retreated w/ abx.  Pt reports sxs continue to come and go.  Not occurring daily.  Pt reports he has discomfort at tip of penis but no pain w/ urination.  Described as an 'ache'.  No skin changes.    Rash- Thursday night developed rash on R flank which has spread to back.  Mild itching last night.  Not itching today.  No pain.  Switched laundry detergent recently and has hx of skin sensitivity.     Review of Systems For ROS see HPI     Objective:   Physical Exam Vitals reviewed.  Constitutional:      General: He is not in acute distress.    Appearance: Normal appearance. He is not ill-appearing.  HENT:     Head: Normocephalic and atraumatic.  Eyes:     Extraocular Movements: Extraocular movements intact.     Conjunctiva/sclera: Conjunctivae normal.  Cardiovascular:     Rate and Rhythm: Normal rate and regular rhythm.  Pulmonary:     Effort: Pulmonary effort is normal. No respiratory distress.  Skin:    General: Skin is warm and dry.     Findings: Lesion (vesicular dermatomal rash of R chest/flank/back consistent w/ shingles) present.  Neurological:     General: No focal deficit present.     Mental Status: He is alert and oriented to person, place, and time.  Psychiatric:        Mood and Affect: Mood normal.        Behavior: Behavior normal.           Assessment & Plan:  Penile pain- new.  Pt states it is more of a discomfort than a pain.  Has been tested for UTI twice and STDs once- tx'd w/ 2 rounds of abx- and sxs are unchanged.  Suspect possible yeast at the urethral meatus.  Start Nystatin BID.  If no improvement, he will  let me know and we can refer to urology.  Pt expressed understanding and is in agreement w/ plan.   Shingles- new.  I have never seen a more textbook rash/distribution.  Start Valtrex.  Pt expressed understanding and is in agreement w/ plan.

## 2023-08-05 NOTE — Patient Instructions (Signed)
 Follow up as needed or as scheduled START the Valtrex 3x/day for the shingles AVOID contact w/ involved areas for others at home Apply the Nystatin cream to the tip of the penis twice daily x5-7 days If no improvement in discomfort, call Dr Pete Glatter Call with any questions or concerns Hang in there!!!

## 2023-08-05 NOTE — Telephone Encounter (Unsigned)
 Copied from CRM [id number]. Topic: Clinical - Medication Question >> Aug 05, 2023  3:52 PM Jackson Cox wrote: Reason for CRM: Patient called in with a question for Dr. Beverely Low regarding medication. Please call [phone]

## 2023-08-06 ENCOUNTER — Encounter: Payer: Self-pay | Admitting: Family Medicine

## 2023-08-06 NOTE — Telephone Encounter (Signed)
 Pt had questions about applying cream please advise any additional directions
